# Patient Record
Sex: Female | Born: 1960 | Race: White | Hispanic: No | Marital: Married
Health system: Southern US, Community
[De-identification: ages and names within clinical notes are randomized; demographics above are authoritative.]

---

## 2018-04-07 ENCOUNTER — Inpatient Hospital Stay (HOSPITAL_COMMUNITY)
Admission: AD | Admit: 2018-04-07 | Discharge: 2018-04-12 | DRG: 200 | Disposition: A | Discharge status: Home / Self Care | Payer: No Typology Code available for payment source | Source: Other Acute Inpatient Hospital | Attending: Surgery | Admitting: Surgery

## 2018-04-07 ENCOUNTER — Emergency Department
Admission: EM | Admit: 2018-04-07 | Discharge: 2018-04-07 | Payer: No Typology Code available for payment source | Attending: Emergency Medicine | Admitting: Emergency Medicine

## 2018-04-07 ENCOUNTER — Emergency Department: Payer: No Typology Code available for payment source

## 2018-04-07 ENCOUNTER — Other Ambulatory Visit: Payer: Self-pay

## 2018-04-07 DIAGNOSIS — Y92008 Other place in unspecified non-institutional (private) residence as the place of occurrence of the external cause: Secondary | ICD-10-CM | POA: Diagnosis not present

## 2018-04-07 DIAGNOSIS — Z9689 Presence of other specified functional implants: Secondary | ICD-10-CM

## 2018-04-07 DIAGNOSIS — S299XXA Unspecified injury of thorax, initial encounter: Secondary | ICD-10-CM | POA: Diagnosis present

## 2018-04-07 DIAGNOSIS — R55 Syncope and collapse: Secondary | ICD-10-CM | POA: Insufficient documentation

## 2018-04-07 DIAGNOSIS — S2242XA Multiple fractures of ribs, left side, initial encounter for closed fracture: Secondary | ICD-10-CM | POA: Diagnosis present

## 2018-04-07 DIAGNOSIS — W133XXA Fall through floor, initial encounter: Secondary | ICD-10-CM | POA: Insufficient documentation

## 2018-04-07 DIAGNOSIS — S2249XA Multiple fractures of ribs, unspecified side, initial encounter for closed fracture: Secondary | ICD-10-CM | POA: Diagnosis present

## 2018-04-07 DIAGNOSIS — S2239XA Fracture of one rib, unspecified side, initial encounter for closed fracture: Secondary | ICD-10-CM | POA: Diagnosis present

## 2018-04-07 DIAGNOSIS — Y9389 Activity, other specified: Secondary | ICD-10-CM | POA: Insufficient documentation

## 2018-04-07 DIAGNOSIS — S270XXA Traumatic pneumothorax, initial encounter: Secondary | ICD-10-CM | POA: Diagnosis not present

## 2018-04-07 DIAGNOSIS — R109 Unspecified abdominal pain: Secondary | ICD-10-CM | POA: Insufficient documentation

## 2018-04-07 DIAGNOSIS — S272XXA Traumatic hemopneumothorax, initial encounter: Principal | ICD-10-CM | POA: Diagnosis present

## 2018-04-07 DIAGNOSIS — Z938 Other artificial opening status: Secondary | ICD-10-CM

## 2018-04-07 DIAGNOSIS — J942 Hemothorax: Secondary | ICD-10-CM

## 2018-04-07 DIAGNOSIS — R0789 Other chest pain: Secondary | ICD-10-CM | POA: Insufficient documentation

## 2018-04-07 DIAGNOSIS — J939 Pneumothorax, unspecified: Secondary | ICD-10-CM

## 2018-04-07 DIAGNOSIS — Y990 Civilian activity done for income or pay: Secondary | ICD-10-CM | POA: Insufficient documentation

## 2018-04-07 LAB — CBC WITH DIFFERENTIAL/PLATELET
BASOS ABS: 0 10*3/uL (ref 0–0.1)
Basophils Relative: 0 %
Eosinophils Absolute: 0.1 10*3/uL (ref 0–0.7)
Eosinophils Relative: 1 %
HEMATOCRIT: 36.5 % (ref 35.0–47.0)
HEMOGLOBIN: 12.2 g/dL (ref 12.0–16.0)
Lymphocytes Relative: 24 %
Lymphs Abs: 2.7 10*3/uL (ref 1.0–3.6)
MCH: 29.8 pg (ref 26.0–34.0)
MCHC: 33.4 g/dL (ref 32.0–36.0)
MCV: 88.9 fL (ref 80.0–100.0)
Monocytes Absolute: 0.8 10*3/uL (ref 0.2–0.9)
Monocytes Relative: 7 %
NEUTROS ABS: 7.6 10*3/uL — AB (ref 1.4–6.5)
NEUTROS PCT: 68 %
Platelets: 285 10*3/uL (ref 150–440)
RBC: 4.1 MIL/uL (ref 3.80–5.20)
RDW: 12.7 % (ref 11.5–14.5)
WBC: 11.2 10*3/uL — ABNORMAL HIGH (ref 3.6–11.0)

## 2018-04-07 LAB — URINALYSIS, COMPLETE (UACMP) WITH MICROSCOPIC
BACTERIA UA: NONE SEEN
Bilirubin Urine: NEGATIVE
Glucose, UA: NEGATIVE mg/dL
Hgb urine dipstick: NEGATIVE
KETONES UR: 5 mg/dL — AB
LEUKOCYTES UA: NEGATIVE
Nitrite: NEGATIVE
Protein, ur: NEGATIVE mg/dL
SPECIFIC GRAVITY, URINE: 1.018 (ref 1.005–1.030)
SQUAMOUS EPITHELIAL / LPF: NONE SEEN (ref 0–5)
pH: 7 (ref 5.0–8.0)

## 2018-04-07 LAB — COMPREHENSIVE METABOLIC PANEL
ALK PHOS: 38 U/L (ref 38–126)
ALT: 14 U/L (ref 0–44)
AST: 21 U/L (ref 15–41)
Albumin: 4.4 g/dL (ref 3.5–5.0)
Anion gap: 8 (ref 5–15)
BILIRUBIN TOTAL: 0.7 mg/dL (ref 0.3–1.2)
BUN: 15 mg/dL (ref 6–20)
CALCIUM: 9.2 mg/dL (ref 8.9–10.3)
CO2: 28 mmol/L (ref 22–32)
Chloride: 103 mmol/L (ref 98–111)
Creatinine, Ser: 0.91 mg/dL (ref 0.44–1.00)
GFR calc Af Amer: >60 mL/min (ref >60)
GFR calc non Af Amer: >60 mL/min (ref >60)
GLUCOSE: 132 mg/dL — AB (ref 70–99)
Potassium: 3.7 mmol/L (ref 3.5–5.1)
Sodium: 139 mmol/L (ref 135–145)
TOTAL PROTEIN: 6.9 g/dL (ref 6.5–8.1)

## 2018-04-07 LAB — LIPASE, BLOOD: Lipase: 29 U/L (ref 11–51)

## 2018-04-07 LAB — MRSA PCR SCREENING: MRSA by PCR: NEGATIVE

## 2018-04-07 MED ORDER — SODIUM CHLORIDE 0.9 % IV BOLUS
1000.0000 mL | Freq: Once | INTRAVENOUS | Status: AC
  Administered 2018-04-07: 1000 mL via INTRAVENOUS

## 2018-04-07 MED ORDER — FENTANYL CITRATE (PF) 100 MCG/2ML IJ SOLN
100.0000 ug | Freq: Once | INTRAMUSCULAR | Status: AC
  Administered 2018-04-07: 100 ug via INTRAVENOUS
  Filled 2018-04-07: qty 2

## 2018-04-07 MED ORDER — MORPHINE SULFATE (PF) 4 MG/ML IV SOLN
6.0000 mg | Freq: Once | INTRAVENOUS | Status: AC
  Administered 2018-04-07: 6 mg via INTRAVENOUS
  Filled 2018-04-07: qty 2

## 2018-04-07 MED ORDER — OXYCODONE HCL 5 MG PO TABS: 5.0000 mg | ORAL_TABLET | ORAL | Status: DC | PRN

## 2018-04-07 MED ORDER — NALOXONE HCL 0.4 MG/ML IJ SOLN: 0.4000 mg | INTRAMUSCULAR | Status: DC | PRN

## 2018-04-07 MED ORDER — HYDROMORPHONE 1 MG/ML IV SOLN
INTRAVENOUS | Status: DC
  Administered 2018-04-07: 22:00:00 via INTRAVENOUS
  Administered 2018-04-07: 1 mg via INTRAVENOUS
  Administered 2018-04-08: 1.2 mg via INTRAVENOUS
  Administered 2018-04-08: 1.8 mg via INTRAVENOUS
  Administered 2018-04-09: 0.6 mg via INTRAVENOUS
  Administered 2018-04-09: 1.8 mg via INTRAVENOUS
  Filled 2018-04-07: qty 25

## 2018-04-07 MED ORDER — MORPHINE SULFATE (PF) 4 MG/ML IV SOLN: 4.0000 mg | Freq: Once | INTRAVENOUS | Status: AC

## 2018-04-07 MED ORDER — ONDANSETRON HCL 4 MG/2ML IJ SOLN
4.0000 mg | Freq: Four times a day (QID) | INTRAMUSCULAR | Status: DC | PRN
  Administered 2018-04-08 (×2): 4 mg via INTRAVENOUS
  Filled 2018-04-07: qty 2

## 2018-04-07 MED ORDER — ONDANSETRON HCL 4 MG/2ML IJ SOLN
INTRAMUSCULAR | Status: AC
  Administered 2018-04-07: 4 mg
  Filled 2018-04-07: qty 2

## 2018-04-07 MED ORDER — ENOXAPARIN SODIUM 40 MG/0.4ML ~~LOC~~ SOLN
40.0000 mg | SUBCUTANEOUS | Status: DC
  Administered 2018-04-08 – 2018-04-11 (×4): 40 mg via SUBCUTANEOUS
  Filled 2018-04-07 (×6): qty 0.4

## 2018-04-07 MED ORDER — ONDANSETRON HCL 4 MG/2ML IJ SOLN
4.0000 mg | Freq: Once | INTRAMUSCULAR | Status: AC
  Administered 2018-04-07: 4 mg via INTRAVENOUS
  Filled 2018-04-07: qty 2

## 2018-04-07 MED ORDER — DIPHENHYDRAMINE HCL 50 MG/ML IJ SOLN
12.5000 mg | Freq: Four times a day (QID) | INTRAMUSCULAR | Status: DC | PRN
  Administered 2018-04-08 – 2018-04-09 (×3): 12.5 mg via INTRAVENOUS
  Filled 2018-04-07 (×3): qty 1

## 2018-04-07 MED ORDER — MORPHINE SULFATE (PF) 4 MG/ML IV SOLN
INTRAVENOUS | Status: AC
  Administered 2018-04-07: 4 mg via INTRAVENOUS
  Filled 2018-04-07: qty 1

## 2018-04-07 MED ORDER — DIPHENHYDRAMINE HCL 12.5 MG/5ML PO ELIX
12.5000 mg | ORAL_SOLUTION | Freq: Four times a day (QID) | ORAL | Status: DC | PRN
  Administered 2018-04-09 (×2): 12.5 mg via ORAL
  Filled 2018-04-07 (×2): qty 5

## 2018-04-07 MED ORDER — SODIUM CHLORIDE 0.9% FLUSH: 9.0000 mL | INTRAVENOUS | Status: DC | PRN

## 2018-04-07 MED ORDER — HYDROMORPHONE HCL 1 MG/ML IJ SOLN
0.5000 mg | Freq: Once | INTRAMUSCULAR | Status: AC
  Administered 2018-04-07: 0.5 mg via INTRAVENOUS
  Filled 2018-04-07: qty 1

## 2018-04-07 MED ORDER — NITROGLYCERIN IN D5W 200-5 MCG/ML-% IV SOLN
INTRAVENOUS | Status: AC
  Filled 2018-04-07: qty 250

## 2018-04-07 MED ORDER — MORPHINE SULFATE (PF) 4 MG/ML IV SOLN
4.0000 mg | INTRAVENOUS | Status: DC | PRN
  Administered 2018-04-07: 4 mg via INTRAVENOUS

## 2018-04-07 MED ORDER — POTASSIUM CHLORIDE IN NACL 20-0.9 MEQ/L-% IV SOLN
INTRAVENOUS | Status: DC
  Administered 2018-04-07 – 2018-04-09 (×3): via INTRAVENOUS
  Filled 2018-04-07 (×3): qty 1000

## 2018-04-07 MED ORDER — IOPAMIDOL (ISOVUE-300) INJECTION 61%
100.0000 mL | Freq: Once | INTRAVENOUS | Status: AC | PRN
  Administered 2018-04-07: 100 mL via INTRAVENOUS

## 2018-04-07 MED ORDER — ONDANSETRON HCL 4 MG/2ML IJ SOLN
4.0000 mg | Freq: Four times a day (QID) | INTRAMUSCULAR | Status: DC | PRN
  Filled 2018-04-07: qty 2

## 2018-04-07 MED ORDER — MORPHINE SULFATE (PF) 4 MG/ML IV SOLN
INTRAVENOUS | Status: AC
  Administered 2018-04-07: 4 mg
  Filled 2018-04-07: qty 1

## 2018-04-07 MED ORDER — OXYCODONE HCL 5 MG PO TABS
10.0000 mg | ORAL_TABLET | ORAL | Status: DC | PRN
  Administered 2018-04-07 – 2018-04-12 (×19): 10 mg via ORAL
  Filled 2018-04-07 (×20): qty 2

## 2018-04-07 MED ORDER — ONDANSETRON 4 MG PO TBDP: 4.0000 mg | ORAL_TABLET | Freq: Four times a day (QID) | ORAL | Status: DC | PRN

## 2018-04-07 MED ORDER — HYDROMORPHONE HCL 1 MG/ML IJ SOLN
1.0000 mg | Freq: Once | INTRAMUSCULAR | Status: AC
  Administered 2018-04-07: 1 mg via INTRAVENOUS
  Filled 2018-04-07: qty 1

## 2018-04-07 NOTE — H&P (Signed)
History   Vanessa Crosby is an 57 y.o. female.   Chief Complaint: No chief complaint on file. FALL  HPI This is a 57 year old female who was transferred from [place] after being injured in a fall.  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  During her work-up, she was found to have multiple left-sided rib fractures with a flail segment and a small apical pneumothorax.  He was requested to transfer to the [facility] trauma service.  She reports only the left-sided rib and flank pain.  She has a mild headache.  She denies neck pain, extremity pain, abdominal pain, or pelvic pain.  She denies shortness of breath.  No past medical history on file.   No family history on file. Social History:  has no tobacco, alcohol, and drug history on file.  Allergies   Allergies  Allergen Reactions  . Penicillins Rash    Home Medications   No medications prior to admission.    Trauma Course   Results for orders placed or performed during the hospital encounter of 04/07/18 (from the past 48 hour(s))  CBC with Differential     Status: Abnormal   Collection Time: 04/07/18 12:14 PM  Result Value Ref Range   WBC 11.2 (H) 3.6 - 11.0 K/uL   RBC 4.10 3.80 - 5.20 MIL/uL   Hemoglobin 12.2 12.0 - 16.0 g/dL   HCT 36.5 35.0 - 47.0 %   MCV 88.9 80.0 - 100.0 fL   MCH 29.8 26.0 - 34.0 pg   MCHC 33.4 32.0 - 36.0 g/dL   RDW 12.7 11.5 - 14.5 %   Platelets 285 150 - 440 K/uL   Neutrophils Relative % 68 %   Neutro Abs 7.6 (H) 1.4 - 6.5 K/uL   Lymphocytes Relative 24 %   Lymphs Abs 2.7 1.0 - 3.6 K/uL   Monocytes Relative 7 %   Monocytes Absolute 0.8 0.2 - 0.9 K/uL   Eosinophils Relative 1 %   Eosinophils Absolute 0.1 0 - 0.7 K/uL   Basophils Relative 0 %   Basophils Absolute 0.0 0 - 0.1 K/uL    Comment: Performed at [facility], [address] [place], [place] [address]  Comprehensive metabolic panel     Status: Abnormal    Collection Time: 04/07/18 12:14 PM  Result Value Ref Range   Sodium 139 135 - 145 mmol/L   Potassium 3.7 3.5 - 5.1 mmol/L   Chloride 103 98 - 111 mmol/L   CO2 28 22 - 32 mmol/L   Glucose, Bld 132 (H) 70 - 99 mg/dL   BUN 15 6 - 20 mg/dL   Creatinine, Ser 0.91 0.44 - 1.00 mg/dL   Calcium 9.2 8.9 - 10.3 mg/dL   Total Protein 6.9 6.5 - 8.1 g/dL   Albumin 4.4 3.5 - 5.0 g/dL   AST 21 15 - 41 U/L   ALT 14 0 - 44 U/L   Alkaline Phosphatase 38 38 - 126 U/L   Total Bilirubin 0.7 0.3 - 1.2 mg/dL   GFR calc non Af Amer >60 >60 mL/min   GFR calc Af Amer >60 >60 mL/min    Comment: (NOTE) The eGFR has been calculated using the CKD EPI equation. This calculation has not been validated in all clinical situations. eGFR's persistently <60 mL/min signify possible Chronic Kidney Disease.    Anion gap 8 5 - 15    Comment: Performed at [facility], [place], [place] [address]  Lipase, blood     Status: None   Collection Time: 04/07/18 12:14 PM  Result Value Ref Range   Lipase 29 11 - 51 U/L    Comment: Performed at [facility], [address] [place], [place] [address]  Urinalysis, Complete w Microscopic     Status: Abnormal   Collection Time: 04/07/18 12:14 PM  Result Value Ref Range   Color, Urine STRAW (A) YELLOW   APPearance CLEAR (A) CLEAR   Specific Gravity, Urine 1.018 1.005 - 1.030   pH 7.0 5.0 - 8.0   Glucose, UA NEGATIVE NEGATIVE mg/dL   Hgb urine dipstick NEGATIVE NEGATIVE   Bilirubin Urine NEGATIVE NEGATIVE   Ketones, ur 5 (A) NEGATIVE mg/dL   Protein, ur NEGATIVE NEGATIVE mg/dL   Nitrite NEGATIVE NEGATIVE   Leukocytes, UA NEGATIVE NEGATIVE   RBC / HPF 0-5 0 - 5 RBC/hpf   WBC, UA 0-5 0 - 5 WBC/hpf   Bacteria, UA NONE SEEN NONE SEEN   Squamous Epithelial / LPF NONE SEEN 0 - 5    Comment: Performed at [facility], [address], [place], [place] [address]   Dg Chest 1 View  Result Date: 04/07/2018 CLINICAL DATA:  Fall at  work. Left rib and flank pain. Shortness of breath. EXAM: CHEST  1 VIEW COMPARISON:  None. FINDINGS: 1230 hours. The heart size and mediastinal contours are normal without evidence of mediastinal hematoma or shift. There is mild atelectasis at both lung bases. There is a small left apical pneumothorax, approximately 10%. Multiple left-sided rib fractures are present, including moderately displaced fractures of the 9th and 10th ribs posteriorly. In addition, there are nondisplaced fractures of the 6, 7th and 8th ribs. Previous cervical fusion noted. IMPRESSION: Multiple left-sided rib fractures, including at least 2 that are moderately displaced. Associated small left apical pneumothorax. Critical Value/emergent results were called by telephone at the time of interpretation on 04/07/2018 at 12:45 pm to Dr. Larae Grooms , who verbally acknowledged these results. Electronically Signed   By: Richardean Sale M.D.   On: 04/07/2018 12:46   Ct Head Wo Contrast  Result Date: 04/07/2018 CLINICAL DATA:  Pain following fall EXAM: CT HEAD WITHOUT CONTRAST CT CERVICAL SPINE WITHOUT CONTRAST TECHNIQUE: Multidetector CT imaging of the head and cervical spine was performed following the standard protocol without intravenous contrast. Multiplanar CT image reconstructions of the cervical spine were also generated. COMPARISON:  None. FINDINGS: CT HEAD FINDINGS Brain: The ventricles are normal in size and configuration. There is no intracranial mass, hemorrhage, extra-axial fluid collection, or midline shift. Gray-white compartments appear normal. No evident acute infarct. Vascular: No hyperdense vessel. There is no appreciable vascular calcification. Skull: The bony calvarium appears intact. Sinuses/Orbits: There is mucosal thickening in several ethmoid air cells bilaterally. Other visualized paranasal sinuses are clear. Orbits appear symmetric bilaterally. Other: Mastoid air cells are clear. CT CERVICAL SPINE FINDINGS Alignment:  There is 2 mm of retrolisthesis of C4 on C5. No other spondylolisthesis is evident. Skull base and vertebrae: The patient is status post anterior screw and plate fixation at C5 and C6 with support hardware intact. Skull base and craniocervical junction regions appear normal. There is no demonstrable fracture. There are no blastic or lytic bone lesions. Soft tissues and spinal canal: Prevertebral soft tissues and predental space regions are normal. No paraspinous lesions. No cord canal hematoma evident. Disc levels: There is ankylosis at C5 and C6 with disc spacer in  this area. There is moderately severe disc space narrowing at C4-5 and C6-7. There are anterior osteophytes at C4, C6, and C7. There is calcification in the anterior ligament at C4-5 and C7-T1. There is facet hypertrophy at several levels bilaterally. There is no frank disc extrusion or stenosis. Upper chest: There is an apical pneumothorax on the left. There is a 4 mm nodular opacity in the posterior segment of the right upper lobe near the apex. There is a nearby 2 mm nodular opacity in this area. Other: None IMPRESSION: CT head: Mucosal thickening in several ethmoid air cells. Study otherwise unremarkable. CT cervical spine: 1.  Postoperative changes at C5 and C6 with support hardware intact. 2. No evident acute fracture. Slight spondylolisthesis at C4-5 is felt to be due to underlying spondylosis. 3.  Multilevel osteoarthritic change. 4. Left apical pneumothorax, reported to the emergency department at the time of recent chest radiograph reporting. Chest CT ordered. 5. Small nodular opacities right upper lobe near the apex. Complete chest CT ordered which no doubt will provide further assessment of this area. Electronically Signed   By: Lowella Grip III M.D.   On: 04/07/2018 14:18   Ct Chest W Contrast  Result Date: 04/07/2018 CLINICAL DATA:  57 y/o F; fall landing on a metal pole with left rib and flank injury. EXAM: CT CHEST, ABDOMEN, AND  PELVIS WITH CONTRAST TECHNIQUE: Multidetector CT imaging of the chest, abdomen and pelvis was performed following the standard protocol during bolus administration of intravenous contrast. CONTRAST:  175m ISOVUE-300 IOPAMIDOL (ISOVUE-300) INJECTION 61% COMPARISON:  04/07/2018 chest radiograph. FINDINGS: CT CHEST FINDINGS Cardiovascular: No significant vascular findings. Normal heart size. No pericardial effusion. Mediastinum/Nodes: No enlarged mediastinal, hilar, or axillary lymph nodes. Thyroid gland, trachea, and esophagus demonstrate no significant findings. Lungs/Pleura: Small left pneumothorax. No consolidation or pleural effusion. Musculoskeletal: Left ribs 6-10 posterior and lateral comminuted acute fractures. The left rib 9-10 posterior acute fractures are at least 1 shaft's width displaced and additional ribs 6-10 fractures are mildly displaced. Left rib 5 anterior nondisplaced acute acute fracture. Left rib 11 posterior comminuted minimally displaced acute fracture. Left rib 12 posterior nondisplaced acute fracture. There is extensive edema and several foci of air within the chest wall within the areas of the fractures extending into the left flank. CT ABDOMEN PELVIS FINDINGS Hepatobiliary: No hepatic injury or perihepatic hematoma. Gallbladder is unremarkable Pancreas: Unremarkable. No pancreatic ductal dilatation or surrounding inflammatory changes. Spleen: No splenic injury or perisplenic hematoma. Adrenals/Urinary Tract: No adrenal hemorrhage or renal injury identified. Bladder is unremarkable. Stomach/Bowel: Stomach is within normal limits. Appendix appears normal. No evidence of bowel wall thickening, distention, or inflammatory changes. Vascular/Lymphatic: No significant vascular findings are present. No enlarged abdominal or pelvic lymph nodes. Reproductive: Uterus and bilateral adnexa are unremarkable. Other: No abdominal wall hernia or abnormality. No abdominopelvic ascites. Musculoskeletal: No  acute or significant osseous findings. IMPRESSION: 1. Left ribs 6-10 posterior and lateral acute fractures. Left ribs 9-10 posterior fractures are at least 1 shaft's width displaced. Left rib 5 anterior as well as left ribs 11-12 posterior acute fractures. 2. Small left pneumothorax is stable. 3. No additional acute fracture or internal injury identified. Electronically Signed   By: LKristine GarbeM.D.   On: 04/07/2018 14:25   Ct Cervical Spine Wo Contrast  Result Date: 04/07/2018 CLINICAL DATA:  Pain following fall EXAM: CT HEAD WITHOUT CONTRAST CT CERVICAL SPINE WITHOUT CONTRAST TECHNIQUE: Multidetector CT imaging of the head and cervical spine was performed following the  standard protocol without intravenous contrast. Multiplanar CT image reconstructions of the cervical spine were also generated. COMPARISON:  None. FINDINGS: CT HEAD FINDINGS Brain: The ventricles are normal in size and configuration. There is no intracranial mass, hemorrhage, extra-axial fluid collection, or midline shift. Gray-white compartments appear normal. No evident acute infarct. Vascular: No hyperdense vessel. There is no appreciable vascular calcification. Skull: The bony calvarium appears intact. Sinuses/Orbits: There is mucosal thickening in several ethmoid air cells bilaterally. Other visualized paranasal sinuses are clear. Orbits appear symmetric bilaterally. Other: Mastoid air cells are clear. CT CERVICAL SPINE FINDINGS Alignment: There is 2 mm of retrolisthesis of C4 on C5. No other spondylolisthesis is evident. Skull base and vertebrae: The patient is status post anterior screw and plate fixation at C5 and C6 with support hardware intact. Skull base and craniocervical junction regions appear normal. There is no demonstrable fracture. There are no blastic or lytic bone lesions. Soft tissues and spinal canal: Prevertebral soft tissues and predental space regions are normal. No paraspinous lesions. No cord canal  hematoma evident. Disc levels: There is ankylosis at C5 and C6 with disc spacer in this area. There is moderately severe disc space narrowing at C4-5 and C6-7. There are anterior osteophytes at C4, C6, and C7. There is calcification in the anterior ligament at C4-5 and C7-T1. There is facet hypertrophy at several levels bilaterally. There is no frank disc extrusion or stenosis. Upper chest: There is an apical pneumothorax on the left. There is a 4 mm nodular opacity in the posterior segment of the right upper lobe near the apex. There is a nearby 2 mm nodular opacity in this area. Other: None IMPRESSION: CT head: Mucosal thickening in several ethmoid air cells. Study otherwise unremarkable. CT cervical spine: 1.  Postoperative changes at C5 and C6 with support hardware intact. 2. No evident acute fracture. Slight spondylolisthesis at C4-5 is felt to be due to underlying spondylosis. 3.  Multilevel osteoarthritic change. 4. Left apical pneumothorax, reported to the emergency department at the time of recent chest radiograph reporting. Chest CT ordered. 5. Small nodular opacities right upper lobe near the apex. Complete chest CT ordered which no doubt will provide further assessment of this area. Electronically Signed   By: Lowella Grip III M.D.   On: 04/07/2018 14:18   Ct Abdomen Pelvis W Contrast  Result Date: 04/07/2018 CLINICAL DATA:  57 y/o F; fall landing on a metal pole with left rib and flank injury. EXAM: CT CHEST, ABDOMEN, AND PELVIS WITH CONTRAST TECHNIQUE: Multidetector CT imaging of the chest, abdomen and pelvis was performed following the standard protocol during bolus administration of intravenous contrast. CONTRAST:  117m ISOVUE-300 IOPAMIDOL (ISOVUE-300) INJECTION 61% COMPARISON:  04/07/2018 chest radiograph. FINDINGS: CT CHEST FINDINGS Cardiovascular: No significant vascular findings. Normal heart size. No pericardial effusion. Mediastinum/Nodes: No enlarged mediastinal, hilar, or axillary  lymph nodes. Thyroid gland, trachea, and esophagus demonstrate no significant findings. Lungs/Pleura: Small left pneumothorax. No consolidation or pleural effusion. Musculoskeletal: Left ribs 6-10 posterior and lateral comminuted acute fractures. The left rib 9-10 posterior acute fractures are at least 1 shaft's width displaced and additional ribs 6-10 fractures are mildly displaced. Left rib 5 anterior nondisplaced acute acute fracture. Left rib 11 posterior comminuted minimally displaced acute fracture. Left rib 12 posterior nondisplaced acute fracture. There is extensive edema and several foci of air within the chest wall within the areas of the fractures extending into the left flank. CT ABDOMEN PELVIS FINDINGS Hepatobiliary: No hepatic injury or perihepatic hematoma.  Gallbladder is unremarkable Pancreas: Unremarkable. No pancreatic ductal dilatation or surrounding inflammatory changes. Spleen: No splenic injury or perisplenic hematoma. Adrenals/Urinary Tract: No adrenal hemorrhage or renal injury identified. Bladder is unremarkable. Stomach/Bowel: Stomach is within normal limits. Appendix appears normal. No evidence of bowel wall thickening, distention, or inflammatory changes. Vascular/Lymphatic: No significant vascular findings are present. No enlarged abdominal or pelvic lymph nodes. Reproductive: Uterus and bilateral adnexa are unremarkable. Other: No abdominal wall hernia or abnormality. No abdominopelvic ascites. Musculoskeletal: No acute or significant osseous findings. IMPRESSION: 1. Left ribs 6-10 posterior and lateral acute fractures. Left ribs 9-10 posterior fractures are at least 1 shaft's width displaced. Left rib 5 anterior as well as left ribs 11-12 posterior acute fractures. 2. Small left pneumothorax is stable. 3. No additional acute fracture or internal injury identified. Electronically Signed   By: Kristine Garbe M.D.   On: 04/07/2018 14:25    Review of Systems  Respiratory:  Negative for shortness of breath.   Cardiovascular: Positive for chest pain.  Gastrointestinal: Negative for abdominal pain.  Musculoskeletal: Negative for back pain and neck pain.  All other systems reviewed and are negative.   Blood pressure 133/77, pulse (!) 107, temperature 98.2 F (36.8 C), temperature source Oral, weight 59.3 kg, SpO2 100 %. Physical Exam  Constitutional: She is oriented to person, place, and time. She appears well-developed and well-nourished.  Uncomfortable appearing lying on her right side  HENT:  Head: Normocephalic and atraumatic.  Right Ear: External ear normal.  Left Ear: External ear normal.  Nose: Nose normal.  Mouth/Throat: Oropharynx is clear and moist.  Eyes: Pupils are equal, round, and reactive to light. Right eye exhibits no discharge. Left eye exhibits no discharge. No scleral icterus.  Neck: Normal range of motion. Neck supple. No tracheal deviation present.  Cardiovascular: Normal rate, regular rhythm, normal heart sounds and intact distal pulses.  No murmur heard. Respiratory: Effort normal. No respiratory distress. She exhibits tenderness.  Some crepitus on the left with decreased breath sounds  GI: Soft. There is no tenderness. There is no guarding.  Musculoskeletal: Normal range of motion. She exhibits no edema or deformity.  Neurological: She is alert and oriented to person, place, and time.  Skin: Skin is warm and dry. No erythema.  Psychiatric: Her behavior is normal. Judgment normal.     Assessment/Plan Patient status post fall with multiple left-sided rib fractures and small pneumothorax  I discussed the diagnosis with the patient and her family.  Aggressive pain management and pulmonary toilet strongly recommended to try and avoid pneumonia or other pulmonary complications.  We will repeat her chest x-ray and if the pneumothorax expands, she may require a chest tube placement.  Also, given the flail segment, if she does not improve,  we may have to consider a consultation from thoracic surgery.  The patient and family are in agreement with the plan  BLACKMAN,DOUGLAS A 04/07/2018, 7:21 PM   Procedures

## 2018-04-07 NOTE — ED Notes (Signed)
Pt gave permission for wife, [place] Nickeletra Fugger, to sign consent for pt to be transferred to [facility].

## 2018-04-07 NOTE — ED Notes (Signed)
EMTALA reviewed by this RN.  

## 2018-04-07 NOTE — ED Triage Notes (Signed)
Pt to ED via [facility] from work site for fall. Pt fell 3 feet and landed on a metal pole injuring left ribs and left flank. Pt then fell approximately 5 feet landing on her buttocks. Pt has bruising to the left flank and is c/o left flank pain. Pt appears uncomfortable at this time.

## 2018-04-07 NOTE — Progress Notes (Signed)
Workers Oncologistcomp info paper from Associate Professormployer added to shadow chart.

## 2018-04-07 NOTE — ED Notes (Signed)
[facility] leaving at this time. 

## 2018-04-07 NOTE — ED Notes (Signed)
Pt was placed on 2 lpm via [place] per verbal order from Dr. Seabron SpatesSchavetiz.

## 2018-04-07 NOTE — Progress Notes (Signed)
Patient transferred from [facility].   Paged doctor for orders but he is currently in the OR, will add orders ASAP.  Patient is in the room lying on her right side which she states is the "most comfortable position".   Wife is at bedside.

## 2018-04-07 NOTE — Progress Notes (Addendum)
1900 Dr. Magnus IvanBlackman paged for order clarification on PCA and morphine order questioned by pharmacist, Aggie Cosierheresa. Awaiting call back from DR. Magnus IvanBlackman.   1930 Bedside shift report, pt resting in bed, family at bedside. Pt A&Ox4, says she's not in pain if she "doesn't move" or take "deep breaths". Pt assessed, see flowsheet. Pt educated about PCA pump that's ordered and incentive spirometer.  Pt understands without assistance. WCTM.   2030 Dr. Magnus IvanBlackman paged for 2nd time. New orders received. Informed MD of growing hematoma on pt's left flank. No new orders. WCTM.   2145 PCA set up and pt educated. Pt states the morphine took some of the "edge off". Spouse at bedside, pt resting comfortably in bed.   2300 Report given to [facility], RN. Pt resting comfortably. NAD.

## 2018-04-07 NOTE — ED Notes (Signed)
Verbal report given to Jodene Namamera, Charity fundraiserN at [facility].

## 2018-04-07 NOTE — ED Provider Notes (Signed)
[facility] Emergency Department Provider Note ____________________________________________   First MD Initiated Contact with Patient 04/07/18 1155     (approximate)  I have reviewed the triage vital signs and the nursing notes.   HISTORY  Chief Complaint Fall  HPI Vanessa Crosby is a 57 y.o. female with a history of GERD who is presenting to the emergency department after a fall.  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  She is having severe, 10 out of 10 sharp pain to her left flank and back which worsens with deep breathing.  She is not on any blood thinners and says that she did not hit her head or neck but almost lost consciousness, she thinks from pain.  History reviewed. No pertinent past medical history.  There are no active problems to display for this patient.    Prior to Admission medications   Not on File    Allergies Penicillins  No family history on file.  Social History Social History   Tobacco Use  . Smoking status: Not on file  Substance Use Topics  . Alcohol use: Not on file  . Drug use: Not on file    Review of Systems  Constitutional: No fever/chills Eyes: No visual changes. ENT: No sore throat. Cardiovascular: Denies chest pain. Respiratory: Denies shortness of breath. Gastrointestinal: No abdominal pain.  No nausea, no vomiting.  No diarrhea.  No constipation. Genitourinary: Negative for dysuria. Musculoskeletal: As above Skin: Negative for rash. Neurological: Negative for headaches, focal weakness or numbness.   ____________________________________________   PHYSICAL EXAM:  VITAL SIGNS: ED Triage Vitals  Enc Vitals Group     BP 04/07/18 1219 117/67     Pulse Rate 04/07/18 1219 98     Resp --      Temp 04/07/18 1219 97.6 F  (36.4 C)     Temp Source 04/07/18 1219 Oral     SpO2 04/07/18 1219 100 %     Weight 04/07/18 1207 140 lb (63.5 kg)     Height 04/07/18 1207 5\' 7"  (1.702 m)     Head Circumference --      Peak Flow --      Pain Score 04/07/18 1219 10     Pain Loc --      Pain Edu? --      Excl. in GC? --     Constitutional: Alert and oriented.  Patient appears extremely uncomfortable, lying on her right side and wincing in pain. Eyes: Conjunctivae are normal.  Head: Atraumatic. Nose: No congestion/rhinnorhea. Mouth/Throat: Mucous membranes are moist.  Neck: No stridor.   Cardiovascular: Normal rate, regular rhythm. Grossly normal heart sounds.  Respiratory: Normal respiratory effort.  No retractions. Lungs CTAB. Gastrointestinal: Soft and nontender. No distention.  Musculoskeletal: No lower extremity tenderness nor edema.  No joint effusions.  Hips are stable bilaterally the patient ranges her bilateral lower externally is 5 out of 5 strength.  Swollen ecchymotic area to the left CVA region that is approximately 4 x 6 cm and raised and very tender.  Also with lateral tenderness to the left-sided chest wall.  No anterior tenderness to palpation of the chest.  No cervical tenderness to palpation nor step-off.  No signs of trauma above the neck.  Neurologic:  Normal speech and language. No gross  focal neurologic deficits are appreciated. Skin:  Skin is warm, dry and intact. No rash noted. Psychiatric: Mood and affect are normal. Speech and behavior are normal.  ____________________________________________   LABS (all labs ordered are listed, but only abnormal results are displayed)  Labs Reviewed  CBC WITH DIFFERENTIAL/PLATELET - Abnormal; Notable for the following components:      Result Value   WBC 11.2 (*)    Neutro Abs 7.6 (*)    All other components within normal limits  COMPREHENSIVE METABOLIC PANEL - Abnormal; Notable for the following components:   Glucose, Bld 132 (*)    All other  components within normal limits  LIPASE, BLOOD  URINALYSIS, COMPLETE (UACMP) WITH MICROSCOPIC   ____________________________________________  EKG   ____________________________________________  RADIOLOGY  Multiple rib fracture on the left side with 2 that appear displaced and a small apical pneumothorax that I discussed with the radiologist, Dr. Purcell MoutonVeazey, who estimated this at approximate 10%.  CT head and C-spine without acute finding.  Left ribs 6 through 10 posterior and lateral acute fractures.  Left ribs 9 through 10 posterior fractures are at least 1 shaft width displaced.  Left rib 5 anterior as well as left ribs 11 and 12 posterior acute fractures.  Small left pneumothorax appears stable.  No abdominal injury detected. ____________________________________________   PROCEDURES  Procedure(s) performed:   .Critical Care Performed by: Myrna BlazerSchaevitz, Yuuki Skeens Matthew, MD Authorized by: Myrna BlazerSchaevitz, Daison Braxton Matthew, MD   Critical care provider statement:    Critical care time (minutes):  45   Critical care was necessary to treat or prevent imminent or life-threatening deterioration of the following conditions:  Trauma   Critical care was time spent personally by me on the following activities:  Discussions with consultants, evaluation of patient's response to treatment, examination of patient, ordering and performing treatments and interventions, ordering and review of laboratory studies, ordering and review of radiographic studies, pulse oximetry, re-evaluation of patient's condition, obtaining history from patient or surrogate and review of old charts    Critical Care performed:    ____________________________________________   INITIAL IMPRESSION / ASSESSMENT AND PLAN / ED COURSE  Pertinent labs & imaging results that were available during my care of the patient were reviewed by me and considered in my medical decision making (see chart for details).  DDX: Multiple rib  fractures, pneumothorax, kidney injury such as a kidney laceration, fracture of the spine, solid organ injury  As part of my medical decision making, I reviewed the following data within the electronic MEDICAL RECORD NUMBERNo prior records on file for review  ----------------------------------------- 2:49 PM on 04/07/2018 -----------------------------------------  Patient at this time with pain better controlled.  She is no longer distressed.  100% oxygen saturation on 2 L nasal cannula oxygen.  Lying on her right side.  Talking calmly with me.  Discussed case with Dr. Maia Planintron who recommends transfer to a trauma center.  [facility] [place]  ----------------------------------------- 3:02 PM on 04/07/2018 -----------------------------------------  Discussed the case with Dr. Janee Mornhompson with [facility].  Accepted the patient onto his service. ____________________________________________   FINAL CLINICAL IMPRESSION(S) / ED DIAGNOSES  Final diagnoses:  Closed fracture of multiple ribs of left side, initial encounter  Traumatic pneumothorax, initial encounter      NEW MEDICATIONS STARTED DURING THIS VISIT:  New Prescriptions   No medications on file     Note:  This document was prepared using Dragon voice recognition software and may include unintentional dictation errors.  Myrna Blazer, MD 04/07/18 [phone]

## 2018-04-08 ENCOUNTER — Inpatient Hospital Stay (HOSPITAL_COMMUNITY): Payer: No Typology Code available for payment source

## 2018-04-08 LAB — BASIC METABOLIC PANEL
Anion gap: 8 (ref 5–15)
BUN: 8 mg/dL (ref 6–20)
CALCIUM: 8.5 mg/dL — AB (ref 8.9–10.3)
CHLORIDE: 104 mmol/L (ref 98–111)
CO2: 25 mmol/L (ref 22–32)
CREATININE: 0.61 mg/dL (ref 0.44–1.00)
GFR calc non Af Amer: >60 mL/min (ref >60)
GLUCOSE: 115 mg/dL — AB (ref 70–99)
Potassium: 3.9 mmol/L (ref 3.5–5.1)
Sodium: 137 mmol/L (ref 135–145)

## 2018-04-08 LAB — CBC
HCT: 35.4 % — ABNORMAL LOW (ref 36.0–46.0)
HEMOGLOBIN: 11.4 g/dL — AB (ref 12.0–15.0)
MCH: 29.3 pg (ref 26.0–34.0)
MCHC: 32.2 g/dL (ref 30.0–36.0)
MCV: 91 fL (ref 78.0–100.0)
Platelets: 245 10*3/uL (ref 150–400)
RBC: 3.89 MIL/uL (ref 3.87–5.11)
RDW: 12 % (ref 11.5–15.5)
WBC: 10.6 10*3/uL — ABNORMAL HIGH (ref 4.0–10.5)

## 2018-04-08 LAB — HIV ANTIBODY (ROUTINE TESTING W REFLEX): HIV SCREEN 4TH GENERATION: NONREACTIVE

## 2018-04-08 MED ORDER — ALPRAZOLAM 0.25 MG PO TABS
0.2500 mg | ORAL_TABLET | Freq: Three times a day (TID) | ORAL | Status: DC | PRN
  Administered 2018-04-09 – 2018-04-12 (×4): 0.25 mg via ORAL
  Filled 2018-04-08 (×4): qty 1

## 2018-04-08 MED ORDER — METHOCARBAMOL 1000 MG/10ML IJ SOLN
1000.0000 mg | Freq: Three times a day (TID) | INTRAVENOUS | Status: DC | PRN
  Administered 2018-04-09: 1000 mg via INTRAVENOUS
  Filled 2018-04-08 (×2): qty 10

## 2018-04-08 MED ORDER — PANTOPRAZOLE SODIUM 40 MG PO TBEC
40.0000 mg | DELAYED_RELEASE_TABLET | Freq: Every day | ORAL | Status: DC
  Administered 2018-04-08 – 2018-04-11 (×4): 40 mg via ORAL
  Filled 2018-04-08 (×4): qty 1

## 2018-04-08 MED ORDER — HYDROMORPHONE HCL 1 MG/ML IJ SOLN
1.0000 mg | Freq: Once | INTRAMUSCULAR | Status: AC
  Administered 2018-04-08: 1 mg via INTRAVENOUS
  Filled 2018-04-08: qty 1

## 2018-04-08 MED ORDER — ACETAMINOPHEN 325 MG PO TABS
650.0000 mg | ORAL_TABLET | Freq: Four times a day (QID) | ORAL | Status: DC | PRN
  Administered 2018-04-08 – 2018-04-10 (×5): 650 mg via ORAL
  Filled 2018-04-08 (×5): qty 2

## 2018-04-08 MED ORDER — PROMETHAZINE HCL 25 MG/ML IJ SOLN
12.5000 mg | Freq: Four times a day (QID) | INTRAMUSCULAR | Status: DC | PRN
  Administered 2018-04-08: 12.5 mg via INTRAVENOUS
  Filled 2018-04-08: qty 1

## 2018-04-08 NOTE — Progress Notes (Signed)
Patient ID: Lottie MusselConnie Crosby, female   DOB: 17-Apr-1961, 57 y.o.   MRN: [id number]    Subjective: L rib pain, SOB  Objective: Vital signs in last 24 hours: Temp:  [97.6 F (36.4 C)-99 F (37.2 C)] 98.2 F (36.8 C) (08/17 0839) Pulse Rate:  [71-111] 85 (08/17 0839) Resp:  [13-20] 18 (08/17 0839) BP: (117-143)/(67-91) 140/86 (08/17 0839) SpO2:  [98 %-100 %] 100 % (08/17 0839) Weight:  [59.3 kg-63.5 kg] 59.3 kg (08/17 0050) Last BM Date: 04/06/18(Per patient)  Intake/Output from previous day: 08/16 0701 - 08/17 0700 In: 657.3 [P.O.:360; I.V.:297.3] Out: 750 [Urine:750] Intake/Output this shift: Total I/O In: 120 [P.O.:120] Out: -   General appearance: alert and cooperative Resp: dec BS on L Chest wall: left sided chest wall tenderness Cardio: regular rate and rhythm GI: soft, non-tender; bowel sounds normal; no masses,  no organomegaly Extremities: extremities normal, atraumatic, no cyanosis or edema Neurologic: Mental status: Alert, oriented, thought content appropriate  Lab Results: CBC  Recent Labs    04/07/18 1214 04/08/18 0252  WBC 11.2* 10.6*  HGB 12.2 11.4*  HCT 36.5 35.4*  PLT 285 245   BMET Recent Labs    04/07/18 1214 04/08/18 0252  NA 139 137  K 3.7 3.9  CL 103 104  CO2 28 25  GLUCOSE 132* 115*  BUN 15 8  CREATININE 0.91 0.61  CALCIUM 9.2 8.5*   Assessment/Plan: Fall from attic through ceiling Mult L rib FX with pneumothorax - PTX larger on CXR, will place 5514fr chest tube, add Robaxin, multimodal pain control FEN - lytes OK, adv diet and decrease IVF VTE - Lovenox Dispo - SDU, I spoke with her wife as well.   LOS: 1 day    Violeta GelinasBurke Kilian Schwartz, MD, MPH, FACS Trauma: [phone] General Surgery: [phone]  04/08/2018

## 2018-04-08 NOTE — Plan of Care (Signed)

## 2018-04-08 NOTE — Procedures (Signed)
Chest Tube Insertion Procedure Note  Indications:  Clinically significant Pneumothorax  Pre-operative Diagnosis: Pneumothorax  Post-operative Diagnosis: Pneumothorax  Procedure Details  Informed consent was obtained for the procedure, including sedation.  Risks of lung perforation, hemorrhage, arrhythmia, and adverse drug reaction were discussed.   After sterile skin prep, using standard technique, a 14 French tube was placed in the left anterior axillary line above nipple level using Seldinger technique. Tube attached to Pleurevac and sutured in place. Sterile dressing placed.  Findings: air  Estimated Blood Loss:  Minimal         Specimens:  None              Complications:  None; patient tolerated the procedure well.         Disposition: remains in SDU         Condition: stable  Vanessa GelinasBurke Jevin Camino, MD, MPH, FACS Trauma: [phone] General Surgery: [phone]

## 2018-04-09 ENCOUNTER — Inpatient Hospital Stay (HOSPITAL_COMMUNITY): Payer: No Typology Code available for payment source

## 2018-04-09 MED ORDER — DOCUSATE SODIUM 100 MG PO CAPS
100.0000 mg | ORAL_CAPSULE | Freq: Two times a day (BID) | ORAL | Status: DC
  Administered 2018-04-09 – 2018-04-10 (×3): 100 mg via ORAL
  Filled 2018-04-09 (×5): qty 1

## 2018-04-09 MED ORDER — POLYETHYLENE GLYCOL 3350 17 GM/SCOOP PO POWD
1.0000 | Freq: Once | ORAL | Status: AC
  Administered 2018-04-09: 255 g via ORAL
  Filled 2018-04-09: qty 255

## 2018-04-09 MED ORDER — HYDROMORPHONE HCL 1 MG/ML IJ SOLN
1.0000 mg | INTRAMUSCULAR | Status: DC | PRN
  Administered 2018-04-09 – 2018-04-12 (×14): 1 mg via INTRAVENOUS
  Filled 2018-04-09 (×14): qty 1

## 2018-04-09 NOTE — Progress Notes (Signed)
PCA d/c and wasted 12mg  with Deanna ArtisKeisha, RN in sink.

## 2018-04-09 NOTE — Evaluation (Signed)
Occupational Therapy Evaluation Patient Details Name: Vanessa Crosby MRN: [id number] DOB: Mar 20, 1961 Today's Date: 04/09/2018    History of Present Illness Pt is a 57 y.o. 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 No past medical history per chart.    Clinical Impression   PTA, pt was independent with ADL and functional mobility, working, and maintaining an active lifestyle. She currently requires overall min assist for dressing, bathing, and toileting tasks including transfers. She is limited by L rib pain and some dizziness which she attributes to medications today. Pt demonstrates excellent motivation to return to independence at this time. She has good family support. She would benefit from continued OT services while admitted to improve independence and safety with ADL and functional mobility prior to returning home but do not anticipate need for OT follow-up post-acute D/C.     Follow Up Recommendations  No OT follow up;Supervision/Assistance - 24 hour(initial 24 hour assistance)    Equipment Recommendations  3 in 1 bedside commode    Recommendations for Other Services       Precautions / Restrictions Precautions Precautions: Fall Precaution Comments: chest tube Restrictions Weight Bearing Restrictions: No      Mobility Bed Mobility Overal bed mobility: Needs Assistance Bed Mobility: Supine to Sit     Supine to sit: Min assist     General bed mobility comments: [facility]   Transfers Overall transfer level: Needs assistance Equipment used: 2 person hand held assist(wife assisting therapist per pt request) Transfers: Sit to/from Stand Sit to Stand: Min assist         General transfer comment: Min assist to power up to standing position.     Balance Overall balance assessment: Mild deficits observed, not formally tested                                          ADL either performed or assessed with clinical judgement   ADL Overall ADL's : Needs assistance/impaired Eating/Feeding: Set up;Sitting   Grooming: Set up;Sitting   Upper Body Bathing: Minimal assistance;Sitting   Lower Body Bathing: Minimal assistance;Sit to/from stand Lower Body Bathing Details (indicate cue type and reason): assist during standing for balance Upper Body Dressing : Minimal assistance;Sitting   Lower Body Dressing: Minimal assistance;Sit to/from stand Lower Body Dressing Details (indicate cue type and reason): Assist for managing pants in standing Toilet Transfer: Ambulation;Minimal assistance Toilet Transfer Details (indicate cue type and reason): min handheld assistance from wife and therapist Toileting- Clothing Manipulation and Hygiene: Minimal assistance;Sit to/from stand       Functional mobility during ADLs: Minimal assistance General ADL Comments: Pt able to complete figure 4 method for LB dressing tasks. She is limited by rib pain but able to manage this well.      Vision Patient Visual Report: No change from baseline Vision Assessment?: No apparent visual deficits     Perception     Praxis      Pertinent Vitals/Pain Pain Assessment: Faces Faces Pain Scale: Hurts little more Pain Location: L ribs Pain Descriptors / Indicators: Aching;Discomfort;Operative site guarding;Sore Pain Intervention(s): Limited activity within patient's tolerance;Monitored during session;Repositioned     Hand Dominance Right   Extremity/Trunk Assessment Upper Extremity Assessment Upper Extremity Assessment: Overall WFL for tasks assessed   Lower Extremity Assessment Lower Extremity Assessment:  Overall WFL for tasks assessed       Communication Communication Communication: No difficulties   Cognition Arousal/Alertness: Awake/alert Behavior During Therapy: WFL for tasks assessed/performed Overall Cognitive Status: Within  Functional Limits for tasks assessed                                     General Comments  Pt's wife present and engaged in session. Pt on 2L O2 with stable SpO2 during session.     Exercises     Shoulder Instructions      Home Living Family/patient expects to be discharged to:: Private residence Living Arrangements: Spouse/significant other Available Help at Discharge: Family;Available 24 hours/day(spouse and adult children) Type of Home: House Home Access: Stairs to enter [employer] Corporation[employer]trance Stairs-Number of Steps: 2 Entrance Stairs-Rails: Right;Left Home Layout: One level     Bathroom Shower/Tub: Walk-in shower         Home Equipment: Grab bars - tub/shower;Hand held shower head          Prior Functioning/Environment Level of Independence: Independent        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        OT Problem List: Decreased strength;Decreased range of motion;Decreased activity tolerance;Impaired balance (sitting and/or standing);Decreased safety awareness;Decreased knowledge of use of DME or AE;Decreased knowledge of precautions;Pain;Cardiopulmonary status limiting activity      OT Treatment/Interventions: Self-care/ADL training;Therapeutic exercise;DME and/or AE instruction;Manual therapy;Therapeutic activities;Patient/family education;Balance training    OT Goals(Current goals can be found in the care plan section) Acute Rehab OT Goals Patient Stated Goal: "to get back to mowing the lawn" OT Goal Formulation: With patient/family Time For Goal Achievement: 04/23/18 Potential to Achieve Goals: Good ADL Goals Pt Will Perform Grooming: Independently;standing Pt Will Perform Upper Body Dressing: Independently;sitting Pt Will Perform Lower Body Dressing: Independently;sit to/from stand Pt Will Transfer to Toilet: ambulating;bedside commode;regular height toilet;with modified independence(BSC over toilet) Pt Will Perform Toileting - Clothing Manipulation  and hygiene: Independently;sit to/from stand Pt Will Perform Tub/Shower Transfer: ambulating;Shower transfer;grab bars;with modified independence  OT Frequency: Min 2X/week   Barriers to D/C:            Co-evaluation              AM-PAC PT "6 Clicks" Daily Activity     Outcome Measure Help from another person eating meals?: None Help from another person taking care of personal grooming?: None Help from another person toileting, which includes using toliet, bedpan, or urinal?: A Little Help from another person bathing (including washing, rinsing, drying)?: A Little Help from another person to put on and taking off regular upper body clothing?: A Little Help from another person to put on and taking off regular lower body clothing?: A Little 6 Click Score: 20   End of Session Nurse Communication: Mobility status  Activity Tolerance: Patient tolerated treatment well Patient left: in chair;with call bell/phone within reach;with family/visitor present  OT Visit Diagnosis: Other abnormalities of gait and mobility (R26.89);Pain Pain - Right/Left: Left Pain - part of body: (ribs)                Time: [id number] OT Time Calculation (min): 25 min Charges:  OT General Charges $OT Visit: 1 Visit OT Evaluation $OT Eval Moderate Complexity: 1 Mod OT Treatments $Self Care/Home Management : 8-22 mins  Doristine Sectionharity A Ulisses Vondrak, MS OTR/L  Pager: [phone]   Lucelia Lacey A Charise Leinbach 04/09/2018, 8:33 AM

## 2018-04-09 NOTE — Progress Notes (Signed)
  Subjective: Up in chair, less pain  Objective: Vital signs in last 24 hours: Temp:  [98.1 F (36.7 C)-98.8 F (37.1 C)] 98.1 F (36.7 C) (08/18 0810) Pulse Rate:  [75-89] 75 (08/18 0810) Resp:  [11-25] 11 (08/18 0820) BP: (101-136)/(64-84) 101/64 (08/18 0810) SpO2:  [96 %-100 %] 100 % (08/18 0820) Last BM Date: 04/06/18(per patient)  Intake/Output from previous day: 08/17 0701 - 08/18 0700 In: 360 [P.O.:360] Out: 2570 [Urine:2500; Chest Tube:70] Intake/Output this shift: Total I/O In: 1946.7 [I.V.:1946.7] Out: 220 [Urine:200; Chest Tube:20]  General appearance: alert and cooperative Resp: clear to auscultation bilaterally and no air leak on CT Chest wall: left sided chest wall tenderness Cardio: regular rate and rhythm GI: soft, non-tender; bowel sounds normal; no masses,  no organomegaly Extremities: edema none  Lab Results: CBC  Recent Labs    04/07/18 1214 04/08/18 0252  WBC 11.2* 10.6*  HGB 12.2 11.4*  HCT 36.5 35.4*  PLT 285 245   BMET Recent Labs    04/07/18 1214 04/08/18 0252  NA 139 137  K 3.7 3.9  CL 103 104  CO2 28 25  GLUCOSE 132* 115*  BUN 15 8  CREATININE 0.91 0.61  CALCIUM 9.2 8.5*   Assessment/Plan: Fall from attic through ceiling Mult L rib FX with pneumothorax - PTX nearly resolved, continue CT to suction FEN - lytes OK, D/C PCA VTE - Lovenox Dispo - SDU, I spoke with her wife as well.   LOS: 2 days    Violeta GelinasBurke Areeba Sulser, MD, MPH, FACS Trauma: [phone] General Surgery: [phone]  8/18/2019Patient ID: Vanessa Crosby, female   DOB: 29-Sep-1960, 57 y.o.   MRN: [id number]

## 2018-04-09 NOTE — Evaluation (Signed)
Physical Therapy Evaluation Patient Details Name: Vanessa MusselConnie Crosby MRN: [id number] DOB: [birth year] Today's Date: 04/09/2018   History of Present Illness  Pt is a 57 y.o. 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 No past medical history per chart.     Clinical Impression  Pt admitted with above diagnosis. Pt currently with functional limitations due to the deficits listed below (see PT Problem List). PTA pt independent and active. On eval, she required min assist bed mobility, min assist transfers and min assist ambulation 60 feet with RW. Gait distance limited by pain. Pt is very motivated to participate in therapy. Pt will benefit from skilled PT to increase their independence and safety with mobility to allow discharge to the venue listed below.       Follow Up Recommendations No PT follow up;Supervision for mobility/OOB    Equipment Recommendations  Other (comment)(TBD with further mobility progress)    Recommendations for Other Services       Precautions / Restrictions Precautions Precautions: Fall;Other (comment) Precaution Comments: chest tube      Mobility  Bed Mobility               General bed mobility comments: Pt received in recliner. Min assist for bed mobility during OT eval this AM.   Transfers Overall transfer level: Needs assistance Equipment used: Rolling walker (2 wheeled) Transfers: Sit to/from Stand Sit to Stand: Min assist         General transfer comment: cues for hand placement, increased time and effort  Ambulation/Gait Ambulation/Gait assistance: Min assist Gait Distance (Feet): 60 Feet Assistive device: Rolling walker (2 wheeled) Gait Pattern/deviations: Step-through pattern;Decreased stride length Gait velocity: slow, guarded gait Gait velocity interpretation: <1.31 ft/sec, indicative of household ambulator General Gait Details: Pt ambulated  on RA with SpO2 93%. 2 L O2 replaced upon return to room with SpO2 99%. Cues for RW management and sequencing with turns.  Stairs            Wheelchair Mobility    Modified Rankin (Stroke Patients Only)       Balance Overall balance assessment: Mild deficits observed, not formally tested                                           Pertinent Vitals/Pain Pain Assessment: Faces Faces Pain Scale: Hurts even more Pain Location: L flank Pain Descriptors / Indicators: Aching;Discomfort;Operative site guarding;Sore Pain Intervention(s): Monitored during session;Repositioned;PCA encouraged    Home Living Family/patient expects to be discharged to:: Private residence Living Arrangements: Spouse/significant other Available Help at Discharge: Family;Available 24 hours/day Type of Home: House Home Access: Stairs to enter Entrance Stairs-Rails: Doctor, general practiceight;Left Entrance Stairs-Number of Steps: 2 Home Layout: One level Home Equipment: Grab bars - tub/shower;Hand held shower head      Prior Function Level of Independence: Independent         Comments: Working as an Occupational hygienistexterminator     Hand Dominance   Dominant Hand: Right    Extremity/Trunk Assessment   Upper Extremity Assessment Upper Extremity Assessment: Overall WFL for tasks assessed    Lower Extremity Assessment Lower Extremity Assessment: Overall WFL for tasks assessed    Cervical / Trunk Assessment Cervical / Trunk Assessment: Normal  Communication   Communication: No difficulties  Cognition Arousal/Alertness: Awake/alert Behavior During Therapy: WFL for tasks assessed/performed  Overall Cognitive Status: Within Functional Limits for tasks assessed                                        General Comments General comments (skin integrity, edema, etc.): Pt's wife present and engaged in session. She pushed IV pole for ambulation.     Exercises     Assessment/Plan    PT  Assessment Patient needs continued PT services  PT Problem List Decreased mobility;Cardiopulmonary status limiting activity;Decreased activity tolerance;Decreased knowledge of use of DME;Pain       PT Treatment Interventions DME instruction;Therapeutic activities;Gait training;Therapeutic exercise;Patient/family education;Balance training;Stair training;Functional mobility training    PT Goals (Current goals can be found in the Care Plan section)  Acute Rehab PT Goals Patient Stated Goal: get back to work PT Goal Formulation: With patient/family Time For Goal Achievement: 04/23/18 Potential to Achieve Goals: Good    Frequency Min 6X/week   Barriers to discharge        Co-evaluation               AM-PAC PT "6 Clicks" Daily Activity  Outcome Measure Difficulty turning over in bed (including adjusting bedclothes, sheets and blankets)?: A Lot Difficulty moving from lying on back to sitting on the side of the bed? : Unable Difficulty sitting down on and standing up from a chair with arms (e.g., wheelchair, bedside commode, etc,.)?: A Lot Help needed moving to and from a bed to chair (including a wheelchair)?: A Little Help needed walking in hospital room?: A Little Help needed climbing 3-5 steps with a railing? : A Lot 6 Click Score: 13    End of Session Equipment Utilized During Treatment: Gait belt Activity Tolerance: Patient tolerated treatment well Patient left: in chair;with call bell/phone within reach;with family/visitor present Nurse Communication: Mobility status PT Visit Diagnosis: Other abnormalities of gait and mobility (R26.89);Pain;Difficulty in walking, not elsewhere classified (R26.2) Pain - Right/Left: Left    Time: 1610-96041210-1241 PT Time Calculation (min) (ACUTE ONLY): 31 min   Charges:   PT Evaluation $PT Eval Moderate Complexity: 1 Mod PT Treatments $Gait Training: 8-22 mins        Aida RaiderWendy Garek Schuneman, PT  Office # [phone] Pager [phone]    Ilda FoilGarrow,  Korey Arroyo Rene 04/09/2018, 1:19 PM

## 2018-04-10 ENCOUNTER — Encounter (HOSPITAL_COMMUNITY): Payer: Self-pay | Admitting: *Deleted

## 2018-04-10 ENCOUNTER — Inpatient Hospital Stay (HOSPITAL_COMMUNITY): Payer: No Typology Code available for payment source

## 2018-04-10 MED ORDER — MAGNESIUM CITRATE PO SOLN
1.0000 | Freq: Once | ORAL | Status: AC
Start: 2018-04-10 — End: 2018-04-10
  Administered 2018-04-10: 1 via ORAL
  Filled 2018-04-10: qty 296

## 2018-04-10 MED ORDER — METHOCARBAMOL 500 MG PO TABS
750.0000 mg | ORAL_TABLET | Freq: Four times a day (QID) | ORAL | Status: DC | PRN
  Administered 2018-04-10 – 2018-04-12 (×6): 750 mg via ORAL
  Filled 2018-04-10 (×6): qty 2

## 2018-04-10 MED ORDER — ACETAMINOPHEN 500 MG PO TABS
1000.0000 mg | ORAL_TABLET | Freq: Four times a day (QID) | ORAL | Status: DC
  Administered 2018-04-10 – 2018-04-12 (×9): 1000 mg via ORAL
  Filled 2018-04-10 (×9): qty 2

## 2018-04-10 NOTE — Progress Notes (Addendum)
[facility] Progress Note     Subjective: CC:  Feels better today. Rates pain as 5/10. Pulling 1,000-1,100 cc on IS. Tolerating PO. Mobilized once in hallway yesterday.    Objective: Vital signs in last 24 hours: Temp:  [98.2 F (36.8 C)-98.9 F (37.2 C)] 98.2 F (36.8 C) (08/19 0745) Pulse Rate:  [78-98] 82 (08/19 0745) Resp:  [11-21] 17 (08/19 0745) BP: (110-134)/(58-86) 124/75 (08/19 0745) SpO2:  [92 %-100 %] 97 % (08/19 0745) Last BM Date: 04/06/18  Intake/Output from previous day: 08/18 0701 - 08/19 0700 In: 2346.3 [I.V.:2346.3] Out: 820 [Urine:350; Chest Tube:470] Intake/Output this shift: Total I/O In: 819.6 [I.V.:769.6; IV Piggyback:50] Out: -   PE: Gen:  Alert, NAD, pleasant and cooperative - up in chair Card:  Regular rate and rhythm, pedal pulses 2+ BL Pulm:  Normal effort, clear to auscultation bilaterally, chest tube dressing clean and dry. SS drainage in CT cannister. Abd: Soft, non-tender, non-distended, bowel sounds present Skin: warm and dry, no rashes  Psych: A&Ox3    Lab Results:  Recent Labs    04/07/18 1214 04/08/18 0252  WBC 11.2* 10.6*  HGB 12.2 11.4*  HCT 36.5 35.4*  PLT 285 245   BMET Recent Labs    04/07/18 1214 04/08/18 0252  NA 139 137  K 3.7 3.9  CL 103 104  CO2 28 25  GLUCOSE 132* 115*  BUN 15 8  CREATININE 0.91 0.61  CALCIUM 9.2 8.5*   CMP     Component Value Date/Time   NA 137 04/08/2018 0252   K 3.9 04/08/2018 0252   CL 104 04/08/2018 0252   CO2 25 04/08/2018 0252   GLUCOSE 115 (H) 04/08/2018 0252   BUN 8 04/08/2018 0252   CREATININE 0.61 04/08/2018 0252   CALCIUM 8.5 (L) 04/08/2018 0252   PROT 6.9 04/07/2018 1214   ALBUMIN 4.4 04/07/2018 1214   AST 21 04/07/2018 1214   ALT 14 04/07/2018 1214   ALKPHOS 38 04/07/2018 1214   BILITOT 0.7 04/07/2018 1214   GFRNONAA >60 04/08/2018 0252   GFRAA >60 04/08/2018 0252   Lipase     Component Value Date/Time   LIPASE 29 04/07/2018 1214    Studies/Results: Dg Chest Port 1 View  Result Date: 04/09/2018 CLINICAL DATA:  Left-sided pneumothorax. EXAM: PORTABLE CHEST 1 VIEW COMPARISON:  04/08/2018 FINDINGS: Left basilar chest tube unchanged. Lungs are adequately inflated with stable small left apical pneumothorax. Stable hazy opacification over the left base likely small effusion/atelectasis. Cardiomediastinal silhouette is within normal. There are multiple left-sided rib fractures unchanged. Remainder of the exam is unchanged. IMPRESSION: Stable mild left base opacification likely small effusion with atelectasis. Left-sided chest tube remains in place with stable small left apical pneumothorax. Electronically Signed   By: Elberta Fortisaniel  Boyle M.D.   On: 04/09/2018 07:23   Dg Chest Port 1 View  Result Date: 04/08/2018 CLINICAL DATA:  Chest tube placement. EXAM: PORTABLE CHEST 1 VIEW COMPARISON:  04/08/2018 FINDINGS: A LEFT thoracostomy tube is now noted. There is no evidence of pneumothorax. Cardiomediastinal silhouette is unremarkable. Mild LEFT basilar atelectasis and multiple LEFT rib fractures are again noted. No other significant change IMPRESSION: LEFT thoracostomy tube placement.  No evidence of pneumothorax. LEFT rib fractures and LEFT basilar atelectasis again noted. Electronically Signed   By: Harmon PierJeffrey  Hu M.D.   On: 04/08/2018 11:30    Anti-infectives: Anti-infectives (From admission, onward)   None     Assessment/Plan Fall from attic through ceiling Mult L rib FX with  pneumothorax - PTX improved, tiny apical, place CT to WS  FEN -Reg diet, saline lock IV, schedule tylenol, PRN PO robaxin, continue PRN oxy, wean dilaudid  VTE - Lovenox Dispo - SDU   LOS: 3 days    Hosie SpangleElizabeth Jupiter Kabir, [facility] Pager: [phone]

## 2018-04-10 NOTE — Progress Notes (Signed)
CSW completed SBIRT with patient. CSW signing off.   Abigail ButtsSusan Xanthe Couillard, LCSWA [phone]

## 2018-04-10 NOTE — Progress Notes (Signed)
Occupational Therapy Treatment Patient Details Name: Vanessa MusselConnie Crosby MRN: [id number] DOB: 05-06-1961 Today's Date: 04/10/2018    History of present illness Pt is a 57 y.o. 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 No past medical history per chart.    OT comments  Pt demonstrating good progress toward OT goals. She was able to complete functional toilet transfers with overall supervision for safety. She is able to complete standing grooming and toileting hygiene tasks with supervision today as well. Pt progressing with activity tolerance for ADL participation as well. D/C recommendation remains appropriate. OT will continue to follow while admitted.    Follow Up Recommendations  No OT follow up;Supervision/Assistance - 24 hour(initial 24 hour assistance)    Equipment Recommendations  3 in 1 bedside commode    Recommendations for Other Services      Precautions / Restrictions Precautions Precautions: Fall;Other (comment) Precaution Comments: chest tube Restrictions Weight Bearing Restrictions: No       Mobility Bed Mobility               General bed mobility comments: OOB in chair   Transfers Overall transfer level: Needs assistance Equipment used: Rolling walker (2 wheeled) Transfers: Sit to/from Stand Sit to Stand: Supervision         General transfer comment: Supervision for safety.     Balance Overall balance assessment: Mild deficits observed, not formally tested                                         ADL either performed or assessed with clinical judgement   ADL Overall ADL's : Needs assistance/impaired     Grooming: Supervision/safety;Standing                   Toilet Transfer: Supervision/safety;Ambulation;RW;BSC Toilet Transfer Details (indicate cue type and reason): supervision for safety; BSC over toilet Toileting- Clothing  Manipulation and Hygiene: Supervision/safety;Sit to/from stand       Functional mobility during ADLs: Supervision/safety;Rolling walker General ADL Comments: Supervision for safety with RW      Vision   Vision Assessment?: No apparent visual deficits   Perception     Praxis      Cognition Arousal/Alertness: Awake/alert Behavior During Therapy: WFL for tasks assessed/performed Overall Cognitive Status: Within Functional Limits for tasks assessed                                          Exercises     Shoulder Instructions       General Comments SpO2 stable on RA throughout    Pertinent Vitals/ Pain       Pain Assessment: 0-10 Pain Score: 8  Pain Location: chest tube insertion site Pain Descriptors / Indicators: Aching;Discomfort;Operative site guarding;Sore Pain Intervention(s): Limited activity within patient's tolerance;Monitored during session;Repositioned  Home Living                                          Prior Functioning/Environment              Frequency  Min 2X/week        Progress Toward Goals  OT Goals(current goals can now be found in the care plan section)  Progress towards OT goals: Progressing toward goals  Acute Rehab OT Goals Patient Stated Goal: get back to work OT Goal Formulation: With patient/family Time For Goal Achievement: 04/23/18 Potential to Achieve Goals: Good  Plan Discharge plan remains appropriate    Co-evaluation                 AM-PAC PT "6 Clicks" Daily Activity     Outcome Measure   Help from another person eating meals?: None Help from another person taking care of personal grooming?: None Help from another person toileting, which includes using toliet, bedpan, or urinal?: None Help from another person bathing (including washing, rinsing, drying)?: A Little Help from another person to put on and taking off regular upper body clothing?: A Little Help from another  person to put on and taking off regular lower body clothing?: A Little 6 Click Score: 21    End of Session Equipment Utilized During Treatment: Gait belt;Rolling walker  OT Visit Diagnosis: Other abnormalities of gait and mobility (R26.89);Pain Pain - Right/Left: Left Pain - part of body: (ribs)   Activity Tolerance Patient tolerated treatment well   Patient Left in chair;with call bell/phone within reach;with family/visitor present   Nurse Communication Mobility status        Time: 1610-96041146-1201 OT Time Calculation (min): 15 min  Charges: OT General Charges $OT Visit: 1 Visit OT Treatments $Self Care/Home Management : 8-22 mins  Vanessa Sectionharity A Vanessa Martelle, MS OTR/L  Pager: [phone]    Vanessa Crosby 04/10/2018, 2:37 PM

## 2018-04-10 NOTE — Progress Notes (Signed)
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 No past medical history per chart.     PT Comments    Patient seen for activity progression. Progressing well with mobility this session and overall in good spirits. Educated patient on continued frequent mobility with staff during hospital course. Will continue to see and progress as tolerated.    Follow Up Recommendations  No PT follow up;Supervision for mobility/OOB     Equipment Recommendations  Other (comment)(TBD with further mobility progress)    Recommendations for Other Services       Precautions / Restrictions Precautions Precautions: Fall;Other (comment) Precaution Comments: chest tube Restrictions Weight Bearing Restrictions: No    Mobility  Bed Mobility               General bed mobility comments: OOB in chair   Transfers Overall transfer level: Needs assistance Equipment used: Rolling walker (2 wheeled) Transfers: Sit to/from Stand Sit to Stand: Supervision         General transfer comment: Supervision for safety.   Ambulation/Gait Ambulation/Gait assistance: Supervision Gait Distance (Feet): 210 Feet Assistive device: Rolling walker (2 wheeled) Gait Pattern/deviations: Step-through pattern;Decreased stride length Gait velocity: improved cadence Gait velocity interpretation: 1.31 - 2.62 ft/sec, indicative of limited community ambulator General Gait Details: steady with ambulation, improvements in speed noted.    Stairs             Wheelchair Mobility    Modified Rankin (Stroke Patients Only)       Balance Overall balance assessment: Mild deficits observed, not formally tested                                           Cognition Arousal/Alertness: Awake/alert Behavior During Therapy: WFL for tasks assessed/performed Overall Cognitive Status: Within Functional Limits for tasks assessed                                        Exercises      General Comments General comments (skin integrity, edema, etc.): VSS throughout      Pertinent Vitals/Pain Pain Assessment: 0-10 Pain Score: 6  Pain Location: chest tube insertion site Pain Descriptors / Indicators: Aching;Discomfort;Operative site guarding;Sore Pain Intervention(s): Monitored during session    Home Living                      Prior Function            PT Goals (current goals can now be found in the care plan section) Acute Rehab PT Goals Patient Stated Goal: get back to work PT Goal Formulation: With patient/family Time For Goal Achievement: 04/23/18 Potential to Achieve Goals: Good Progress towards PT goals: Progressing toward goals    Frequency    Min 6X/week      PT Plan Current plan remains appropriate    Co-evaluation              AM-PAC PT "6 Clicks" Daily Activity  Outcome Measure  Difficulty turning over in bed (including adjusting bedclothes, sheets and blankets)?: A Little  Difficulty moving from lying on back to sitting on the side of the bed? : A Little Difficulty sitting down on and standing up from a chair with arms (e.g., wheelchair, bedside commode, etc,.)?: A Little Help needed moving to and from a bed to chair (including a wheelchair)?: A Little Help needed walking in hospital room?: A Little Help needed climbing 3-5 steps with a railing? : A Lot 6 Click Score: 17    End of Session Equipment Utilized During Treatment: Gait belt Activity Tolerance: Patient tolerated treatment well Patient left: in chair;with call bell/phone within reach;with family/visitor present Nurse Communication: Mobility status PT Visit Diagnosis: Other abnormalities of gait and mobility  (R26.89);Pain;Difficulty in walking, not elsewhere classified (R26.2) Pain - Right/Left: Left     Time: [id number] PT Time Calculation (min) (ACUTE ONLY): 15 min  Charges:  $Gait Training: 8-22 mins                     Charlotte Crumbevon Ahmir Bracken, PT DPT  Board Certified Neurologic Specialist [phone]    Fabio AsaDevon J Pierce Biagini 04/10/2018, 4:24 PM

## 2018-04-11 ENCOUNTER — Inpatient Hospital Stay (HOSPITAL_COMMUNITY): Payer: No Typology Code available for payment source

## 2018-04-11 ENCOUNTER — Encounter (HOSPITAL_COMMUNITY): Payer: Self-pay

## 2018-04-11 NOTE — Progress Notes (Signed)
[facility] Progress Note     Subjective: CC:  Reports 7-8 BMs last night after drinking laxative. Pain continues to improve. Tolerating PO. Reports pulling ~1300 on IS.  Objective: Vital signs in last 24 hours: Temp:  [97.7 F (36.5 C)-98.3 F (36.8 C)] 98 F (36.7 C) (08/20 0521) Pulse Rate:  [71-88] 85 (08/20 0000) Resp:  [14-21] 16 (08/20 0621) BP: (100-136)/(62-77) 109/64 (08/20 0521) SpO2:  [93 %-99 %] 98 % (08/20 0521) Last BM Date: 04/10/18  Intake/Output from previous day: 08/19 0701 - 08/20 0700 In: 1539.6 [P.O.:720; I.V.:769.6; IV Piggyback:50] Out: 270 [Chest Tube:270] Intake/Output this shift: No intake/output data recorded.  PE: Gen:  Alert, NAD, pleasant Card:  Regular rate and rhythm, pedal pulses 2+ BL Pulm:  Normal effort, clear to auscultation bilaterally, CT w/ no air leak. On WS. Abd: Soft, non-tender, non-distended, bowel sounds present/I Skin: warm and dry, no rashes  Psych: A&Ox3   Lab Results:  CMP     Component Value Date/Time   NA 137 04/08/2018 0252   K 3.9 04/08/2018 0252   CL 104 04/08/2018 0252   CO2 25 04/08/2018 0252   GLUCOSE 115 (H) 04/08/2018 0252   BUN 8 04/08/2018 0252   CREATININE 0.61 04/08/2018 0252   CALCIUM 8.5 (L) 04/08/2018 0252   PROT 6.9 04/07/2018 1214   ALBUMIN 4.4 04/07/2018 1214   AST 21 04/07/2018 1214   ALT 14 04/07/2018 1214   ALKPHOS 38 04/07/2018 1214   BILITOT 0.7 04/07/2018 1214   GFRNONAA >60 04/08/2018 0252   GFRAA >60 04/08/2018 0252   Lipase     Component Value Date/Time   LIPASE 29 04/07/2018 1214   Studies/Results: Dg Chest Port 1 View  Result Date: 04/10/2018 CLINICAL DATA:  Left pneumothorax, chest tube EXAM: PORTABLE CHEST 1 VIEW COMPARISON:  04/09/2018 FINDINGS: Left chest tube remains in place, unchanged. Small residual left apical pneumothorax is stable. Bibasilar atelectasis and small left effusion. Right lung clear. No acute bony abnormality. Heart is normal size.  IMPRESSION: Left chest tube remains in place with small residual left apical pneumothorax, stable. Left base atelectasis and small effusion. Electronically Signed   By: Charlett NoseKevin  Dover M.D.   On: 04/10/2018 08:38    Anti-infectives: Anti-infectives (From admission, onward)   None     Assessment/Plan Fall from attic through ceiling Mult L rib FX with pneumothorax- PTX improved, CT [facility] 270cc[facility]. FEN-Reg diet, saline lock IV, scheduled tylenol, PRN robaxin, PRN oxy, wean dilaudid  VTE- Lovenox Dispo- SDU   LOS: 4 days    Hosie SpangleElizabeth Marni Franzoni, [facility] Pager: [phone]

## 2018-04-11 NOTE — Care Management Note (Signed)
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  PTA, pt independent, lives with spouse.                   Action/Plan: PT/OT recommending no OP follow up.  Spouse able to provide care at discharge.    Vladimir Creeksavid Cook with [facility] [employer] is following for discharge needs: Phone: [phone] Fax:  [phone]  [facility], per his request.    Expected Discharge Date:                  Expected Discharge Plan:  Home/Self Care  In-House Referral:  Clinical Social Work  Discharge planning Services  CM Consult  Post Acute Care Choice:    Choice offered to:     DME Arranged:    DME Agency:     HH Arranged:    HH Agency:     Status of Service:  In process, will continue to follow  If discussed at Long Length of Stay Meetings, dates discussed:    Additional Comments:  Quintella BatonJulie W. Ramey Ketcherside, RN, BSN  Trauma/Neuro ICU Case Manager [phone]

## 2018-04-11 NOTE — Progress Notes (Signed)
Physical Therapy Treatment Patient Details Name: Vanessa MusselConnie Blase MRN: [id number] DOB: [birth year] Today's Date: 04/11/2018    History of Present Illness Pt is a 57 y.o. 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 No past medical history per chart.     PT Comments    Pt improving steadily, limited mostly by CT.  Today, emphasis was progressive ambulation and balance without the use of the RW.     Follow Up Recommendations  No PT follow up;Supervision for mobility/OOB     Equipment Recommendations  None recommended by PT    Recommendations for Other Services       Precautions / Restrictions Precautions Precautions: Fall Precaution Comments: chest tube    Mobility  Bed Mobility Overal bed mobility: Needs Assistance Bed Mobility: Sidelying to Sit;Sit to Sidelying;Rolling Rolling: Supervision Sidelying to sit: Supervision     Sit to sidelying: Supervision General bed mobility comments: cues for technique and pt demonstrated slowly, but safely  Transfers Overall transfer level: Needs assistance Equipment used: Rolling walker (2 wheeled);None Transfers: Sit to/from Stand Sit to Stand: Supervision            Ambulation/Gait Ambulation/Gait assistance: Supervision   Assistive device: None;Rolling walker (2 wheeled) Gait Pattern/deviations: Step-through pattern Gait velocity: able to increased speed to command Gait velocity interpretation: 1.31 - 2.62 ft/sec, indicative of limited community ambulator General Gait Details: Warm up with the RW and then pt carried her chest tube, increased speed significantly to cuing.   Stairs             Wheelchair Mobility    Modified Rankin (Stroke Patients Only)       Balance Overall balance assessment: Needs assistance   Sitting balance-Leahy Scale: Good       Standing balance-Leahy Scale: Good                              Cognition Arousal/Alertness: Awake/alert Behavior During Therapy: WFL for tasks assessed/performed Overall Cognitive Status: Within Functional Limits for tasks assessed                                        Exercises      General Comments General comments (skin integrity, edema, etc.): vss      Pertinent Vitals/Pain Pain Assessment: Faces Faces Pain Scale: Hurts little more Pain Location: chest tube insertion site Pain Descriptors / Indicators: Aching;Discomfort;Operative site guarding;Sore Pain Intervention(s): Monitored during session    Home Living                      Prior Function            PT Goals (current goals can now be found in the care plan section) Acute Rehab PT Goals Patient Stated Goal: get back to work PT Goal Formulation: With patient/family Time For Goal Achievement: 04/23/18 Potential to Achieve Goals: Good Progress towards PT goals: Progressing toward goals    Frequency    Min 6X/week      PT Plan Current plan remains appropriate    Co-evaluation              AM-PAC PT "6 Clicks" Daily Activity  Outcome Measure  Difficulty turning over in bed (including adjusting bedclothes, sheets and blankets)?: A Little Difficulty  moving from lying on back to sitting on the side of the bed? : A Little Difficulty sitting down on and standing up from a chair with arms (e.g., wheelchair, bedside commode, etc,.)?: A Little Help needed moving to and from a bed to chair (including a wheelchair)?: A Little Help needed walking in hospital room?: A Little Help needed climbing 3-5 steps with a railing? : A Little 6 Click Score: 18    End of Session   Activity Tolerance: Patient tolerated treatment well Patient left: in chair;with call bell/phone within reach;with family/visitor present Nurse Communication: Mobility status PT Visit Diagnosis: Other abnormalities of gait and mobility (R26.89);Pain Pain -  Right/Left: Left     Time:  -     Charges:  $Gait Training: 8-22 mins                     04/11/2018  Torreon BingKen [facility] Violet, PT [phone]  (pager)   Eliseo GumKenneth V Royce Sciara 04/11/2018, 6:03 PM

## 2018-04-12 ENCOUNTER — Inpatient Hospital Stay (HOSPITAL_COMMUNITY): Payer: No Typology Code available for payment source

## 2018-04-12 MED ORDER — ACETAMINOPHEN 500 MG PO TABS: 1000.0000 mg | ORAL_TABLET | Freq: Four times a day (QID) | ORAL | 0 refills | Status: AC | PRN

## 2018-04-12 MED ORDER — OXYCODONE HCL 10 MG PO TABS: 10.0000 mg | ORAL_TABLET | ORAL | 0 refills | Status: AC | PRN

## 2018-04-12 MED ORDER — HYDROMORPHONE HCL 1 MG/ML IJ SOLN: 0.5000 mg | Freq: Two times a day (BID) | INTRAMUSCULAR | Status: DC | PRN

## 2018-04-12 NOTE — Discharge Instructions (Signed)
Rib Fracture ° °A rib fracture is a break or crack in one of the bones of the ribs. The ribs are a group of long, curved bones that wrap around your chest and attach to your spine. They protect your lungs and other organs in the chest cavity. A broken or cracked rib is often painful, but most do not cause other problems. Most rib fractures heal on their own over time. However, rib fractures can be more serious if multiple ribs are broken or if broken ribs move out of place and push against other structures. °What are the causes? °· A direct blow to the chest. For example, this could happen during contact sports, a car accident, or a fall against a hard object. °· Repetitive movements with high force, such as pitching a baseball or having severe coughing spells. °What are the signs or symptoms? °· Pain when you breathe in or cough. °· Pain when someone presses on the injured area. °How is this diagnosed? °Your caregiver will perform a physical exam. Various imaging tests may be ordered to confirm the diagnosis and to look for related injuries. These tests may include a chest X-ray, computed tomography (CT), magnetic resonance imaging (MRI), or a bone scan. °How is this treated? °Rib fractures usually heal on their own in 1-3 months. The longer healing period is often associated with a continued cough or other aggravating activities. During the healing period, pain control is very important. Medication is usually given to control pain. Hospitalization or surgery may be needed for more severe injuries, such as those in which multiple ribs are broken or the ribs have moved out of place. °Follow these instructions at home: °· Avoid strenuous activity and any activities or movements that cause pain. Be careful during activities and avoid bumping the injured rib. °· Gradually increase activity as directed by your caregiver. °· Only take over-the-counter or prescription medications as directed by your caregiver. Do not take  other medications without asking your caregiver first. °· Apply ice to the injured area for the first 1-2 days after you have been treated or as directed by your caregiver. Applying ice helps to reduce inflammation and pain. °? Put ice in a plastic bag. °? Place a towel between your skin and the bag. °? Leave the ice on for 15-20 minutes at a time, every 2 hours while you are awake. °· Perform deep breathing as directed by your caregiver. This will help prevent pneumonia, which is a common complication of a broken rib. Your caregiver may instruct you to: °? Take deep breaths several times a day. °? Try to cough several times a day, holding a pillow against the injured area. °? Use a device called an incentive spirometer to practice deep breathing several times a day. °· Drink enough fluids to keep your urine clear or pale yellow. This will help you avoid constipation. °· Do not wear a rib belt or binder. These restrict breathing, which can lead to pneumonia. °Get help right away if: °· You have a fever. °· You have difficulty breathing or shortness of breath. °· You develop a continual cough, or you cough up thick or bloody sputum. °· You feel sick to your stomach (nausea), throw up (vomit), or have abdominal pain. °· You have worsening pain not controlled with medications. °This information is not intended to replace advice given to you by your health care provider. Make sure you discuss any questions you have with your health care provider. °Document Released: 08/09/2005   Document Revised: 01/15/2016 Document Reviewed: 10/11/2012 °Elsevier Interactive Patient Education © 2018 [employer] ° °

## 2018-04-12 NOTE — Progress Notes (Signed)
[facility] Progress Note     Subjective: CC:  Pain continues to improve. Pulling 1500 cc on IS. Bowel movements have slowed down.  Objective: Vital signs in last 24 hours: Temp:  [97.5 F (36.4 C)-98.2 F (36.8 C)] 97.8 F (36.6 C) (08/21 0310) Pulse Rate:  [78-87] 78 (08/21 0310) Resp:  [12-16] 12 (08/21 0310) BP: (103-149)/(62-78) 149/75 (08/21 0310) SpO2:  [95 %-100 %] 96 % (08/21 0310) Last BM Date: 04/11/18  Intake/Output from previous day: 08/20 0701 - 08/21 0700 In: 700 [P.O.:700] Out: 20 [Chest Tube:20] Intake/Output this shift: No intake/output data recorded.  PE: Gen:  Alert, NAD, pleasant Card:  Regular rate and rhythm, pedal pulses 2+ BL Pulm:  Normal effort, crackles left lung base otherwise CTAB, CT w/ no air leak. On WS. Abd: Soft, non-tender, non-distended, bowel sounds present/I Skin: warm and dry, no rashes  Psych: A&Ox3   Lab Results:  CMP     Component Value Date/Time   NA 137 04/08/2018 0252   K 3.9 04/08/2018 0252   CL 104 04/08/2018 0252   CO2 25 04/08/2018 0252   GLUCOSE 115 (H) 04/08/2018 0252   BUN 8 04/08/2018 0252   CREATININE 0.61 04/08/2018 0252   CALCIUM 8.5 (L) 04/08/2018 0252   PROT 6.9 04/07/2018 1214   ALBUMIN 4.4 04/07/2018 1214   AST 21 04/07/2018 1214   ALT 14 04/07/2018 1214   ALKPHOS 38 04/07/2018 1214   BILITOT 0.7 04/07/2018 1214   GFRNONAA >60 04/08/2018 0252   GFRAA >60 04/08/2018 0252   Lipase     Component Value Date/Time   LIPASE 29 04/07/2018 1214       Studies/Results: Dg Chest Port 1 View  Result Date: 04/11/2018 CLINICAL DATA:  Hemo pneumothorax with chest tube in place EXAM: PORTABLE CHEST 1 VIEW COMPARISON:  Jan 08, 2018 FINDINGS: Chest tube remains in place on the left. There is a small pleural effusion on the left and a small apical pneumothorax. The pneumothorax is smaller compared to 1 day prior. No tension component. There is mild left base atelectasis. Lungs elsewhere clear.  Heart size and pulmonary vascularity are normal. No adenopathy. There is aortic atherosclerosis. There are stable rib fractures on the left. No new fracture evident. IMPRESSION: Chest tube remains on the left. Small hydropneumothorax on the left with pneumothorax slightly smaller compared to 1 day prior. No tension component. Left base atelectasis noted. Right lung clear. Stable cardiac silhouette. There is aortic atherosclerosis. Stable rib fractures on the left. Aortic Atherosclerosis (ICD10-I70.0). Electronically Signed   By: Bretta BangWilliam  Woodruff III M.D.   On: 04/11/2018 08:10    Anti-infectives: Anti-infectives (From admission, onward)   None     Assessment/Plan Fall from attic through ceiling Mult L rib FX with pneumothorax-  tiny residual apical PTX, CT w/ 20cc/24h, remove CT today and get follow up chest CT. FEN-Reg diet,saline lock IV,scheduled tylenol,PRN robaxin,PRN oxy, wean dilaudid VTE- Lovenox Dispo- SDU, PM discharge if remains stable s/p CT removal   LOS: 5 days    Vanessa [facility], [facility] Pager: [phone]

## 2018-04-12 NOTE — Discharge Summary (Signed)
[facility] Discharge Summary   Patient ID: Vanessa [facility] MRN: [id number] DOB/AGE: [birth year] 57 y.o.  Admit date: 04/07/2018 Discharge date: 04/12/2018  Discharge Diagnosis Patient Active Problem List   Diagnosis Date Noted   Fall through ceiling     Left pneumothorax    . Multiple rib fractures, left  04/07/2018   Imaging: Dg Chest Port 1 View  Result Date: 04/12/2018 CLINICAL DATA:  Hemopneumothorax, chest tube EXAM: PORTABLE CHEST 1 VIEW COMPARISON:  04/11/2018 FINDINGS: Pigtail catheter overlying the left lung base unchanged in position. Small left apical pneumothorax unchanged. Small left pleural effusion unchanged. Progression of mild left lower lobe atelectasis. Multiple left rib fractures Right lung remains clear IMPRESSION: Small left apical pneumothorax unchanged. Small left effusion unchanged. Progressive left lower lobe atelectasis. Electronically Signed   By: Marlan Palauharles  Clark M.D.   On: 04/12/2018 08:18   Dg Chest Port 1 View  Result Date: 04/11/2018 CLINICAL DATA:  Hemo pneumothorax with chest tube in place EXAM: PORTABLE CHEST 1 VIEW COMPARISON:  Jan 08, 2018 FINDINGS: Chest tube remains in place on the left. There is a small pleural effusion on the left and a small apical pneumothorax. The pneumothorax is smaller compared to 1 day prior. No tension component. There is mild left base atelectasis. Lungs elsewhere clear. Heart size and pulmonary vascularity are normal. No adenopathy. There is aortic atherosclerosis. There are stable rib fractures on the left. No new fracture evident. IMPRESSION: Chest tube remains on the left. Small hydropneumothorax on the left with pneumothorax slightly smaller compared to 1 day prior. No tension component. Left base atelectasis noted. Right lung clear. Stable cardiac silhouette. There is aortic atherosclerosis. Stable rib fractures on the left. Aortic Atherosclerosis (ICD10-I70.0). Electronically Signed   By: Bretta BangWilliam  Woodruff  III M.D.   On: 04/11/2018 08:10    Procedures Dr. Violeta GelinasBurke Thompson (04/08/18) - chest tube insertion   Hospital Course:  57 y/o female who was [facility] [place] after she fell through the ceiling at work, landing on her left flank/back. Workup significant for multiple left-sided rib fractures and small apical pneumothorax. Patient was admitted for observation and multimodal pain control. On hospital day #1 the pneumothorax was larger on chest X-ray and patient was slightly short of breath. A pigtail chest tube was placed as above and pneumothorax improved. On 04/12/18 the patients chest tube was removed. On 04/12/18 the patients vitals were stable, pain improving, tolerating PO, mobilizing, and medically stable for discharge. She will follow up as below and knows to call with questions/concerns. Work note provided.    Allergies as of 04/12/2018      Reactions   Penicillins Rash      Medication List    STOP taking these medications   HYDROcodone-acetaminophen 7.5-325 MG tablet Commonly known as:  NORCO     TAKE these medications   acetaminophen 500 MG tablet Commonly known as:  TYLENOL Take 2 tablets (1,000 mg total) by mouth every 6 (six) hours as needed.   albuterol 108 (90 Base) MCG/ACT inhaler Commonly known as:  PROVENTIL HFA;VENTOLIN HFA Inhale 2 puffs into the lungs every 6 (six) hours as needed for wheezing or shortness of breath.   ALPRAZolam 0.5 MG tablet Commonly known as:  XANAX Take 0.5 mg by mouth at bedtime as needed for anxiety.   meloxicam 7.5 MG tablet Commonly known as:  MOBIC Take 7.5 mg by mouth daily as needed for pain (migraine).   omeprazole 40 MG capsule Commonly  known as:  PRILOSEC Take 40 mg by mouth daily.   Oxycodone HCl 10 MG Tabs Take 1 tablet (10 mg total) by mouth every 4 (four) hours as needed for severe pain.   Senna 8.6 MG Caps Take 8.6 mg by mouth daily.        Follow-up Information    [facility]  [place]. Go on 04/25/2018.   Why:  at 10 AM for follow up from recent rib fractures and chest tube. please get a chest X-ray prior to your appointment. Please arrive 30 minutes early. Contact information: [address] [place] [id number] [phone]          Signed: Hosie Spanglelizabeth Aicia Babinski, [facility] 04/12/2018, 1:09 PM

## 2018-04-12 NOTE — Progress Notes (Signed)
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 No past medical history per chart.     PT Comments    Pt in chair on arrival with friends visiting. Pt able to use restroom, ambulate 300 ft w/ no AD, and ascend/descend two stairs w/ rail on right with supervision for lines. Pt reports tenderness at site of previous chest tube but feels functioning is close to baseline. Some lightheadedness with gait, vitals checked and stable. Pt reports this has happened before prior to injury. Pt encouraged to take 5-10 seconds each time changes position to gather bearings and assume no lightheadedness prior to transfer or ambulation.      Follow Up Recommendations  No PT follow up;Supervision for mobility/OOB     Equipment Recommendations  None recommended by PT    Recommendations for Other Services       Precautions / Restrictions Precautions Precautions: Fall Precaution Comments: chest tube removed today  Restrictions Weight Bearing Restrictions: No    Mobility  Bed Mobility               General bed mobility comments: OOB in recliner on arrival   Transfers Overall transfer level: Independent Equipment used: None Transfers: Sit to/from Stand Sit to Stand: Independent         General transfer comment: +1 to manage lines   Ambulation/Gait Ambulation/Gait assistance: Supervision Gait Distance (Feet): 300 Feet Assistive device: None Gait Pattern/deviations: WFL(Within Functional Limits) Gait velocity: able to increase speed on command  Gait velocity interpretation: >4.37 ft/sec, indicative of normal walking speed General Gait Details: able to tolerate challenge, mild lightheadedness reported with gait, VSS,     Stairs Stairs: Yes Stairs assistance: Supervision Stair Management: One rail Right Number of Stairs: 2 General stair comments: supervision for lines    Wheelchair Mobility    Modified Rankin (Stroke Patients Only)       Balance Overall balance assessment: Independent Sitting-balance support: Feet supported;No upper extremity supported Sitting balance-Leahy Scale: Good     Standing balance support: No upper extremity supported;During functional activity Standing balance-Leahy Scale: Good                   Standardized Balance Assessment Standardized Balance Assessment : Dynamic Gait Index   Dynamic Gait Index Level Surface: Normal Change in Gait Speed: Normal Gait with Horizontal Head Turns: Normal Gait with Vertical Head Turns: Normal Gait and Pivot Turn: Normal Step Over Obstacle: Mild Impairment Step Around Obstacles: Normal Steps: Mild Impairment Total Score: 22      Cognition Arousal/Alertness: Awake/alert Behavior During Therapy: WFL for tasks assessed/performed Overall Cognitive Status: Within Functional Limits for tasks assessed                                        Exercises      General Comments General comments (skin integrity, edema, etc.): VSS      Pertinent Vitals/Pain Pain Assessment: Faces Faces Pain Scale: Hurts a little bit Pain Location: chest tube insertion site Pain Descriptors / Indicators: Sore;Other (Comment);Tightness(pulling ) Pain Intervention(s): Limited activity within patient's tolerance;Monitored during session;Patient requesting pain meds-RN notified    Home Living  Prior Function            PT Goals (current goals can now be found in the care plan section) Progress towards PT goals: Progressing toward goals    Frequency    Min 6X/week      PT Plan Current plan remains appropriate    Co-evaluation              AM-PAC PT "6 Clicks" Daily  Activity  Outcome Measure  Difficulty turning over in bed (including adjusting bedclothes, sheets and blankets)?: A Little Difficulty moving from lying on back to sitting on the side of the bed? : A Little Difficulty sitting down on and standing up from a chair with arms (e.g., wheelchair, bedside commode, etc,.)?: None Help needed moving to and from a bed to chair (including a wheelchair)?: A Little Help needed walking in hospital room?: None Help needed climbing 3-5 steps with a railing? : None 6 Click Score: 21    End of Session Equipment Utilized During Treatment: Gait belt Activity Tolerance: Patient tolerated treatment well Patient left: in chair;with family/visitor present;with call bell/phone within reach Nurse Communication: Mobility status;Patient requests pain meds PT Visit Diagnosis: Other abnormalities of gait and mobility (R26.89);Pain Pain - Right/Left: Left     Time: [id number] PT Time Calculation (min) (ACUTE ONLY): 23 min  Charges:  $Gait Training: 8-22 mins $Therapeutic Activity: 8-22 mins                     Vanessa [facility], [place]  Acute Rehab [phone]    Vanessa LairKara Swannie [facility] 04/12/2018, 5:45 PM

## 2018-04-12 NOTE — Progress Notes (Addendum)
Occupational Therapy Treatment and Discharge Patient Details Name: Vanessa Crosby MRN: [id number] DOB: [birth year] Today's Date: 04/12/2018    History of present illness Pt is a 57 y.o. 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 No past medical history per chart.    OT comments  Pt demonstrating good progress and has met all OT goals at this time. She is able to complete ADL and functional mobility for ADL transfers independently at this time. Educated pt concerning safety precautions as well as activity progression post-acute D/C and she and spouse verbalize and demonstrate understanding. No further acute OT needs identified at this time. Will check back as able.     Follow Up Recommendations  No OT follow up;Supervision/Assistance - 24 hour    Equipment Recommendations  None recommended by OT    Recommendations for Other Services      Precautions / Restrictions Precautions Precautions: Fall Precaution Comments: chest tube Restrictions Weight Bearing Restrictions: No       Mobility Bed Mobility               General bed mobility comments: OOB in recliner on my arrival.   Transfers Overall transfer level: Independent Equipment used: None                  Balance Overall balance assessment: Needs assistance Sitting-balance support: No upper extremity supported;Feet supported Sitting balance-Leahy Scale: Good     Standing balance support: No upper extremity supported;During functional activity Standing balance-Leahy Scale: Good                             ADL either performed or assessed with clinical judgement   ADL Overall ADL's : Independent                                       General ADL Comments: Able to complete all ADL tasks independently at this time. She was able to demonstrate techniques for LB dressing tasks, shower  transfers, and toilet transfers.      Vision   Vision Assessment?: No apparent visual deficits   Perception     Praxis      Cognition Arousal/Alertness: Awake/alert Behavior During Therapy: WFL for tasks assessed/performed Overall Cognitive Status: Within Functional Limits for tasks assessed                                          Exercises     Shoulder Instructions       General Comments VSS    Pertinent Vitals/ Pain       Pain Assessment: Faces Faces Pain Scale: Hurts a little bit Pain Location: chest tube insertion site Pain Descriptors / Indicators: Aching;Discomfort;Sore Pain Intervention(s): Monitored during session  Home Living                                          Prior Functioning/Environment              Frequency  Min 2X/week        Progress Toward Goals  OT Goals(current goals can now  be found in the care plan section)  Progress towards OT goals: Goals met/education completed, patient discharged from OT  Acute Rehab OT Goals Patient Stated Goal: get back to work OT Goal Formulation: With patient/family Time For Goal Achievement: 04/23/18 Potential to Achieve Goals: Good ADL Goals Pt Will Perform Grooming: Independently;standing Pt Will Perform Upper Body Dressing: Independently;sitting Pt Will Perform Lower Body Dressing: Independently;sit to/from stand Pt Will Transfer to Toilet: ambulating;bedside commode;regular height toilet;with modified independence(BSC over toilet) Pt Will Perform Toileting - Clothing Manipulation and hygiene: Independently;sit to/from stand Pt Will Perform Tub/Shower Transfer: ambulating;Shower transfer;grab bars;with modified independence  Plan Discharge plan remains appropriate    Co-evaluation                 AM-PAC PT "6 Clicks" Daily Activity     Outcome Measure   Help from another person eating meals?: None Help from another person taking care of personal  grooming?: None Help from another person toileting, which includes using toliet, bedpan, or urinal?: None Help from another person bathing (including washing, rinsing, drying)?: None Help from another person to put on and taking off regular upper body clothing?: None Help from another person to put on and taking off regular lower body clothing?: None 6 Click Score: 24    End of Session    OT Visit Diagnosis: Other abnormalities of gait and mobility (R26.89);Pain Pain - Right/Left: Left Pain - part of body: (ribs)   Activity Tolerance Patient tolerated treatment well   Patient Left in chair;with call bell/phone within reach   Nurse Communication Mobility status        Time: 1000-1011 OT Time Calculation (min): 11 min  Charges: OT General Charges $OT Visit: 1 Visit OT Treatments $Self Care/Home Management : 8-22 mins  Norman Herrlich, MS OTR/L  Pager: [place] [facility] L[facility] Hendriks 04/12/2018, 12:25 PM

## 2018-04-12 NOTE — Progress Notes (Signed)
Spoke with P.A this morning. Order for chest tube removal, chest tube removed before noon and order for repeat chest xray within and hour was put in. Patient has had no discharge.   Area with edema (goose egg/knot) from landing site on left flank has decreased significantly but patient still has echymosis.  Skin underneath where the tape was is not red or sore but patient c/o itching.   Chest tube removed and covered with petroleum dressing,  2x2 gauze and tape. Patient is in the chair on her side.

## 2018-04-12 NOTE — Care Management Note (Addendum)
Case Management Note  Patient Details  Name: Vanessa Crosby MRN: [id number] Date of Birth: [birth year]  Subjective/Objective:  Pt is a 57 y.o. 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  PTA, pt independent, lives with spouse.                   Action/Plan: PT/OT recommending no OP follow up.  Spouse able to provide care at discharge.    Vladimir Creeksavid Cook with [facility] [employer] is following for discharge needs: Phone: [phone] Fax:  [phone]  [facility] per his request.    Expected Discharge Date:  04/12/18               Expected Discharge Plan:  Home/Self Care  In-House Referral:  Clinical Social Work  Discharge planning Services  CM Consult  Post Acute Care Choice:    Choice offered to:     DME Arranged:    DME Agency:     HH Arranged:    HH Agency:     Status of Service:  Completed, signed off  If discussed at [employer] Meetings, dates discussed:    Additional Comments:  04/12/18 J. Wolfe Camarena, RN, BSN Completed and signed FMLA paperwork returned to pt.  Pt for discharge later today with spouse.  No dc needs identified.    Quintella BatonJulie W. Rossana Molchan, RN, BSN  Trauma/Neuro ICU Case Manager [phone]

## 2018-04-25 ENCOUNTER — Ambulatory Visit
Admission: RE | Admit: 2018-04-25 | Discharge: 2018-04-25 | Disposition: A | Discharge status: Home / Self Care | Payer: No Typology Code available for payment source | Source: Ambulatory Visit | Attending: General Surgery | Admitting: General Surgery

## 2018-04-25 ENCOUNTER — Other Ambulatory Visit: Payer: Self-pay | Admitting: General Surgery

## 2018-04-25 DIAGNOSIS — S2239XD Fracture of one rib, unspecified side, subsequent encounter for fracture with routine healing: Secondary | ICD-10-CM

## 2018-05-30 ENCOUNTER — Ambulatory Visit
Admission: RE | Admit: 2018-05-30 | Discharge: 2018-05-30 | Disposition: A | Discharge status: Home / Self Care | Payer: No Typology Code available for payment source | Source: Ambulatory Visit | Attending: General Surgery | Admitting: General Surgery

## 2018-05-30 ENCOUNTER — Other Ambulatory Visit: Payer: Self-pay | Admitting: General Surgery

## 2018-05-30 DIAGNOSIS — R0781 Pleurodynia: Secondary | ICD-10-CM

## 2018-10-22 IMAGING — CR DG CHEST 2V
2 series · 2 of 2 positions shown · non-contrast
Comparison: Chest x-ray dated April 25, 2018.

CLINICAL DATA: Left-sided chest pain. History of rib fractures and
left pneumothorax.

EXAM:
CHEST - 2 VIEW

[w chest pa]
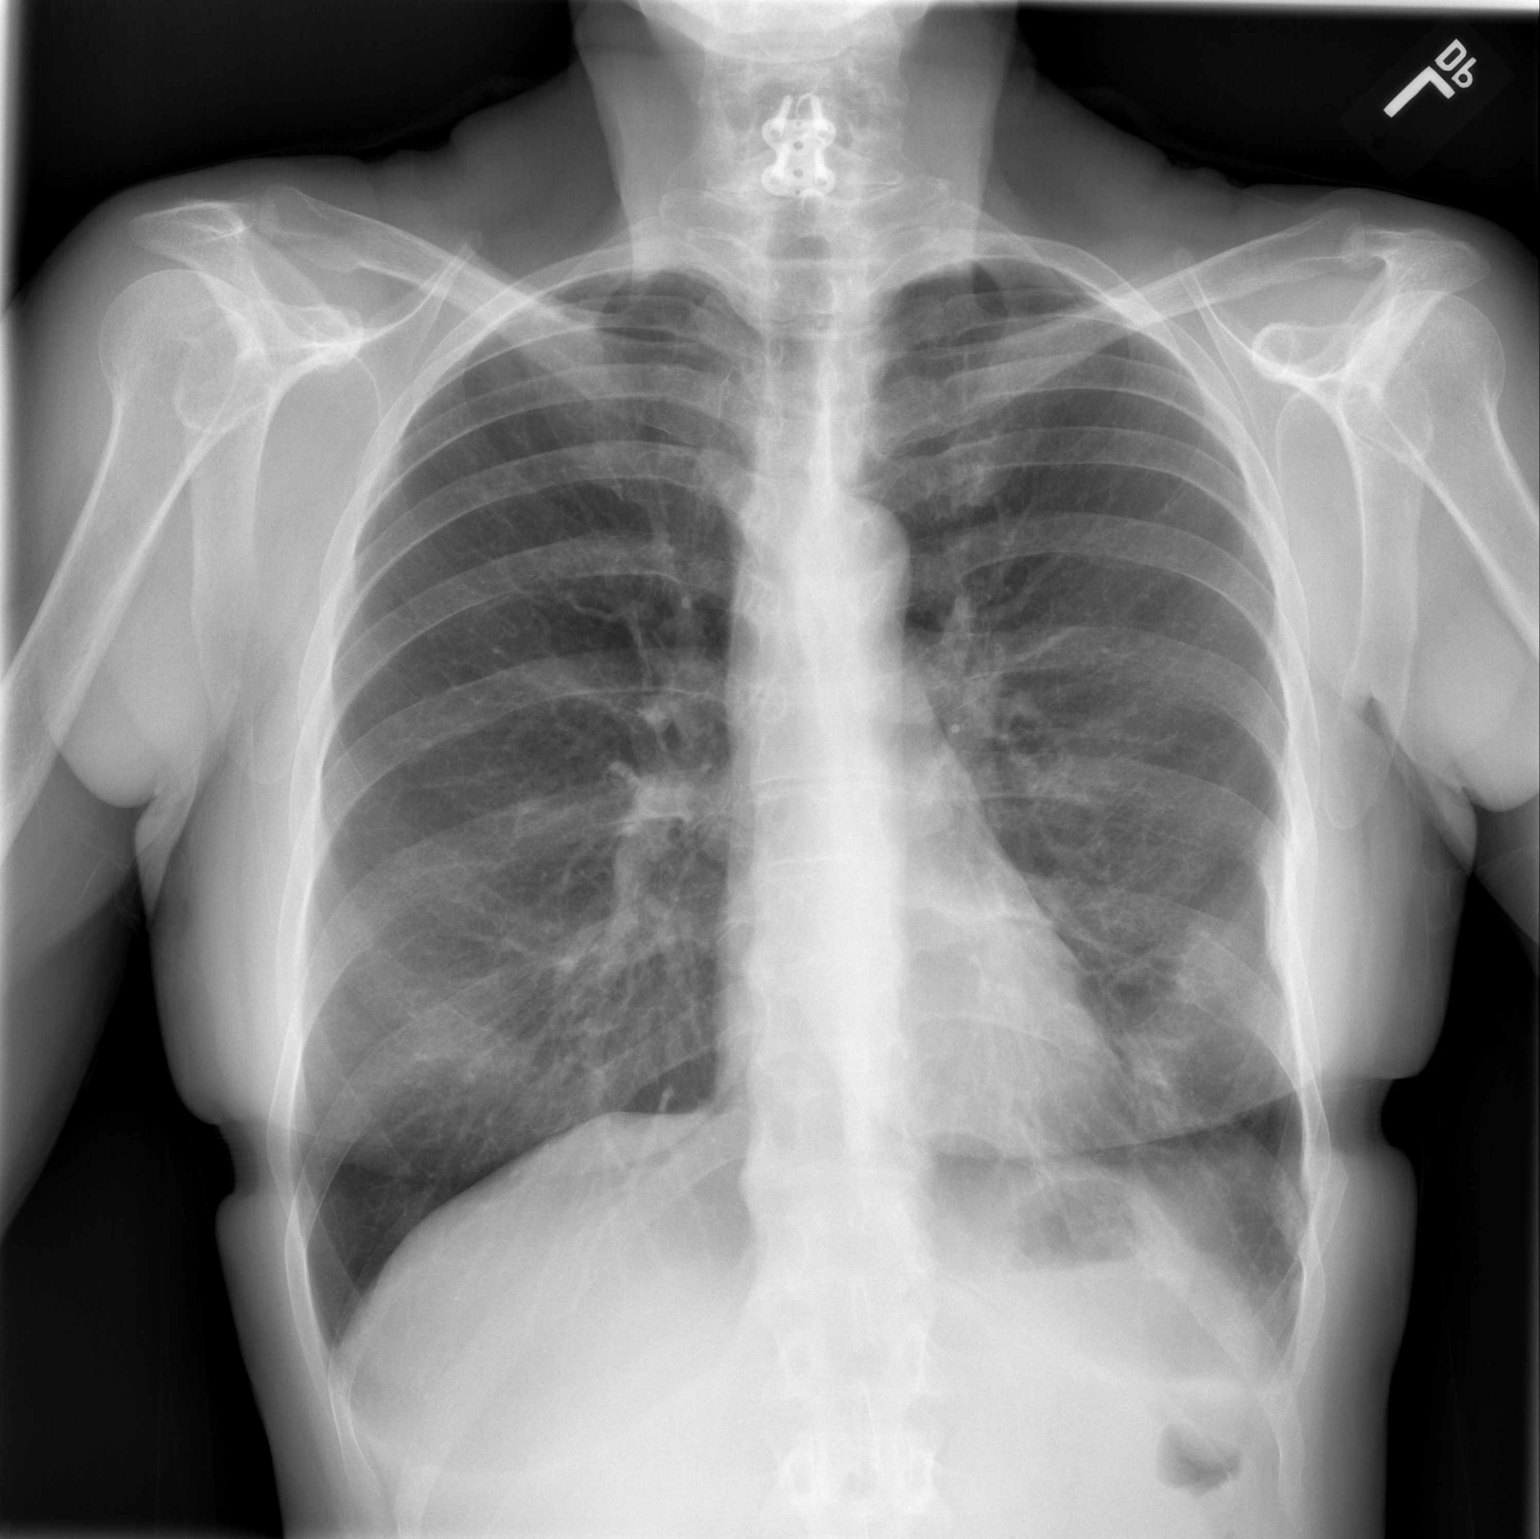

[w chest lat]
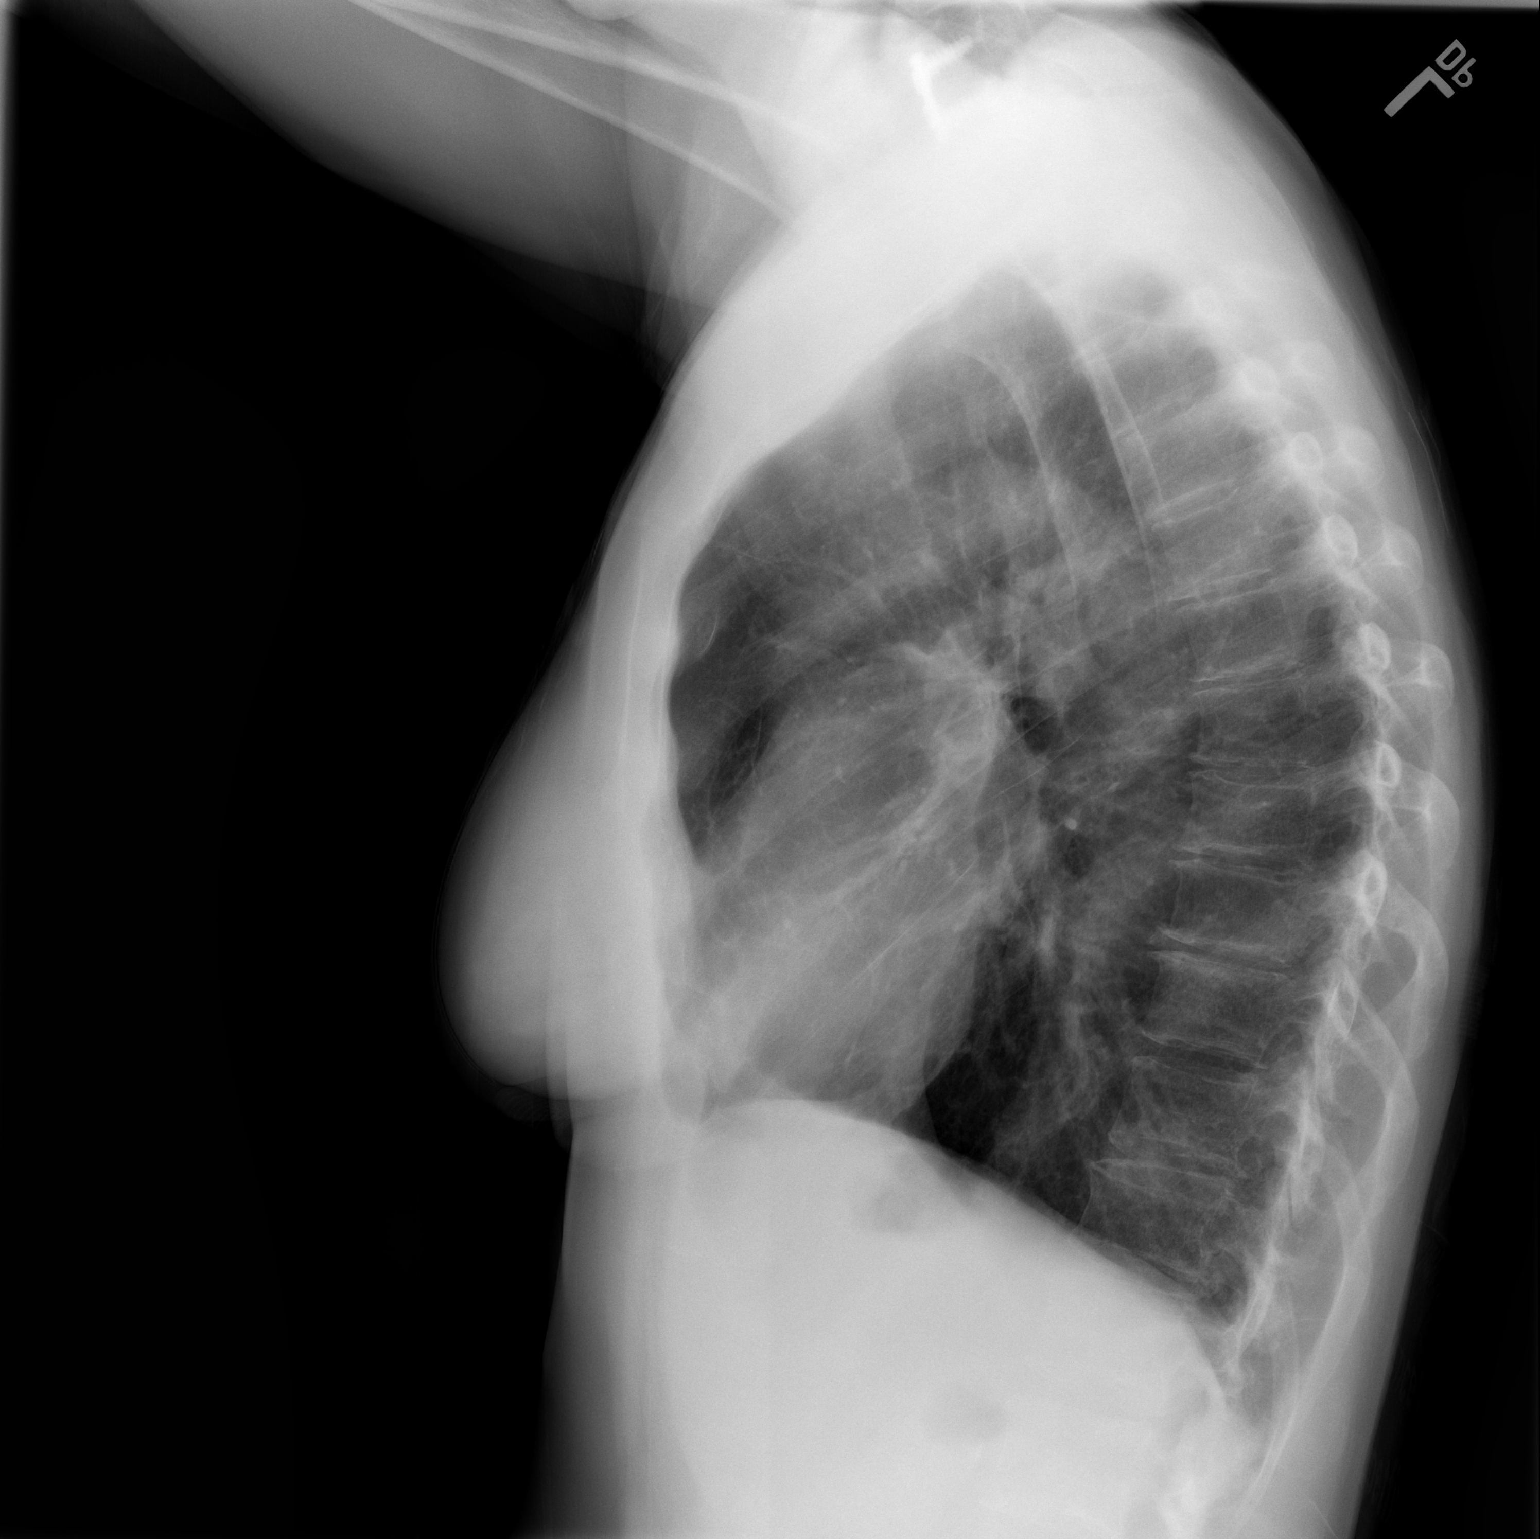

[2 of 2 positions shown; findings below may reference images not displayed]

FINDINGS: The heart size and mediastinal contours are within normal limits.
Normal pulmonary vascularity. Resolved left apical pneumothorax and
small left pleural effusion. No consolidation. Healing left-sided
rib fractures.
IMPRESSION: 1. Resolved left apical pneumothorax and left pleural effusion.
2. Healing left-sided rib fractures.

## 2019-08-02 ENCOUNTER — Other Ambulatory Visit: Payer: Self-pay | Admitting: Orthopaedic Surgery

## 2019-08-02 DIAGNOSIS — M5414 Radiculopathy, thoracic region: Secondary | ICD-10-CM

## 2019-08-06 ENCOUNTER — Other Ambulatory Visit: Payer: Self-pay

## 2019-08-06 ENCOUNTER — Ambulatory Visit
Admission: RE | Admit: 2019-08-06 | Discharge: 2019-08-06 | Disposition: A | Discharge status: Home / Self Care | Payer: BC Managed Care – PPO | Source: Ambulatory Visit | Attending: Orthopaedic Surgery | Admitting: Orthopaedic Surgery

## 2019-08-06 DIAGNOSIS — M5414 Radiculopathy, thoracic region: Secondary | ICD-10-CM

## 2019-12-29 IMAGING — CT CT T SPINE W/O CM
1 series · 12 of 14 positions shown, 15 images · non-contrast
Comparison: None.

CLINICAL DATA: Thoracic spine pain since a fall from roof in March 2018. Pain radiates into the right anterior chest.

EXAM:
CT THORACIC SPINE WITHOUT CONTRAST
TECHNIQUE: Multidetector CT images of the thoracic were obtained using the
standard protocol without intravenous contrast.

[Series 3: t spine soft · axial · 0.27mm/px · z∈[-342,-78]mm · 12 of 106 slices shown, 15 images]
[im 9/106  soft-tissue]
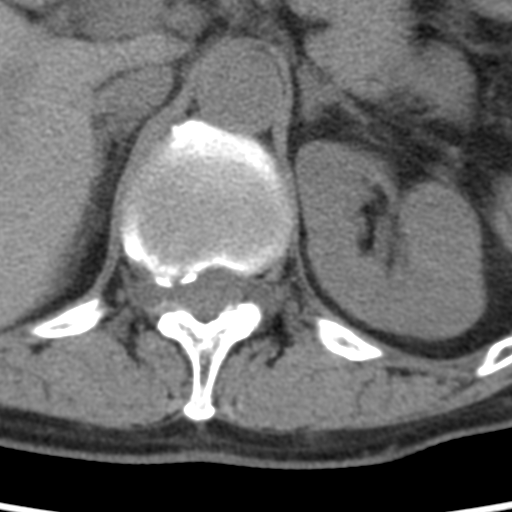
[im 9/106  bone]
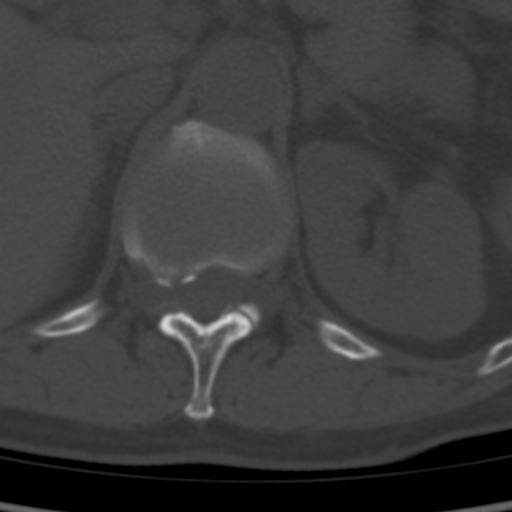
[im 17/106  bone]
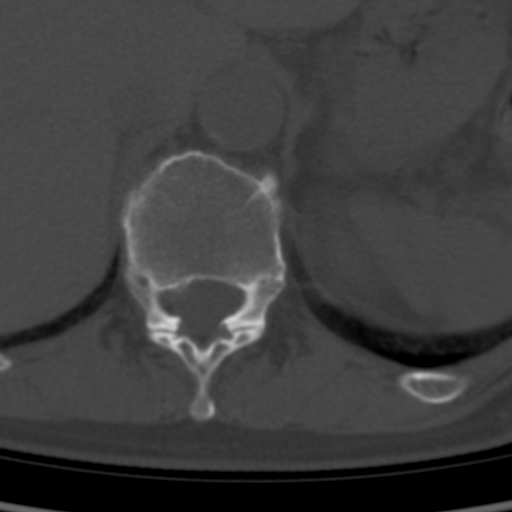
[im 25/106  bone]
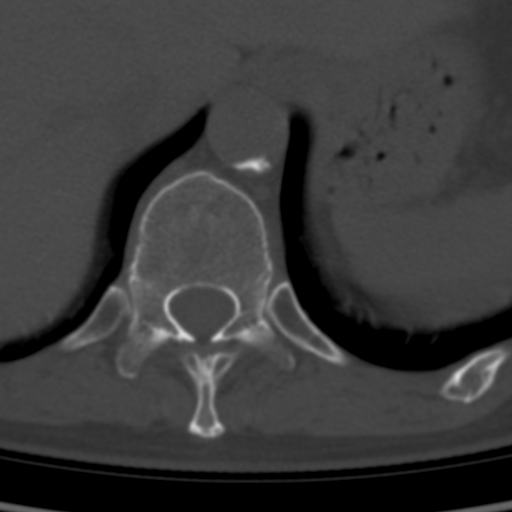
[im 33/106  bone]
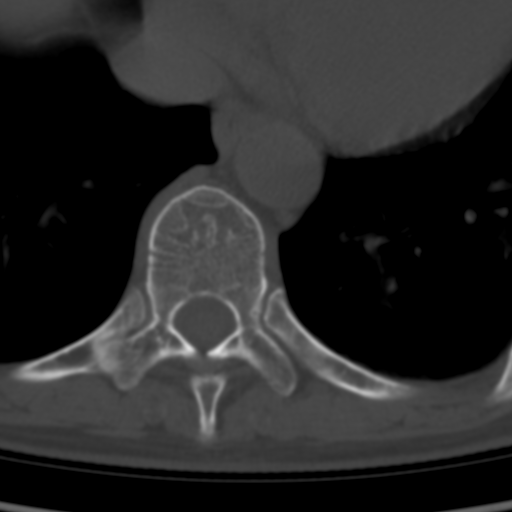
[im 41/106  soft-tissue]
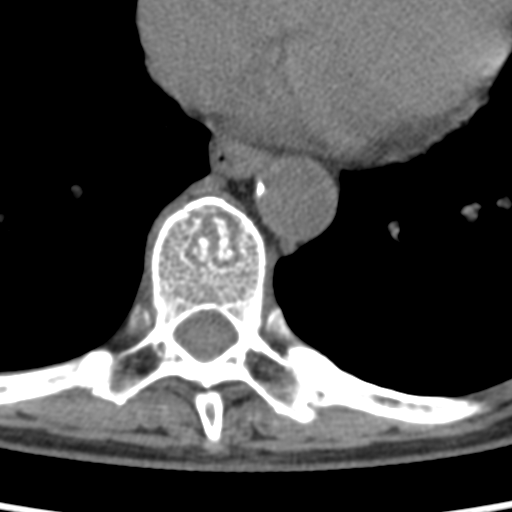
[im 41/106  bone]
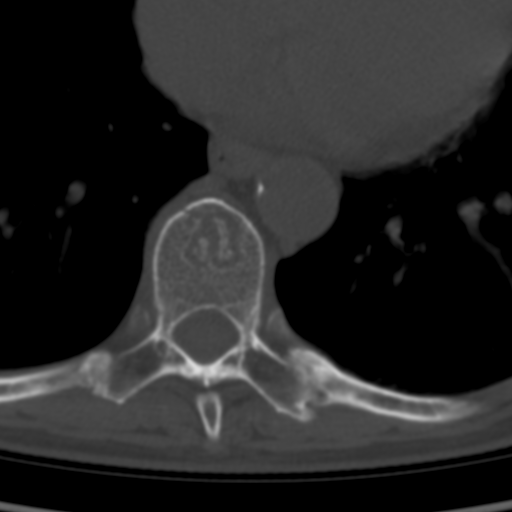
[im 49/106  bone]
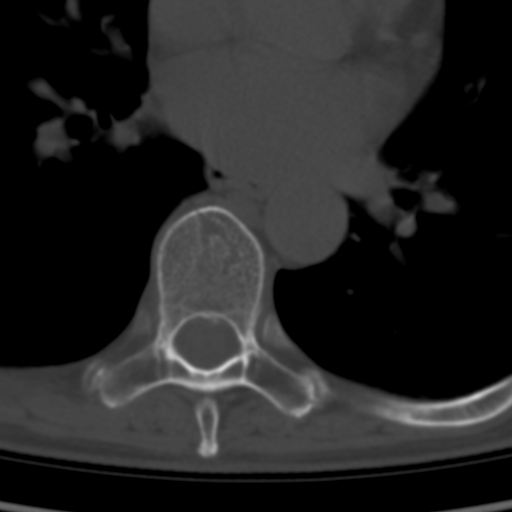
[im 57/106  bone]
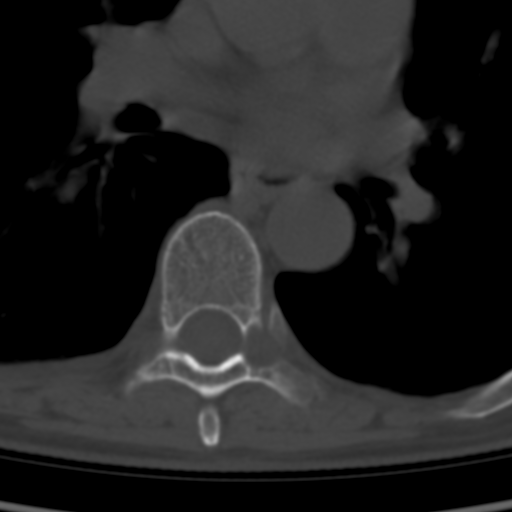
[im 65/106  bone]
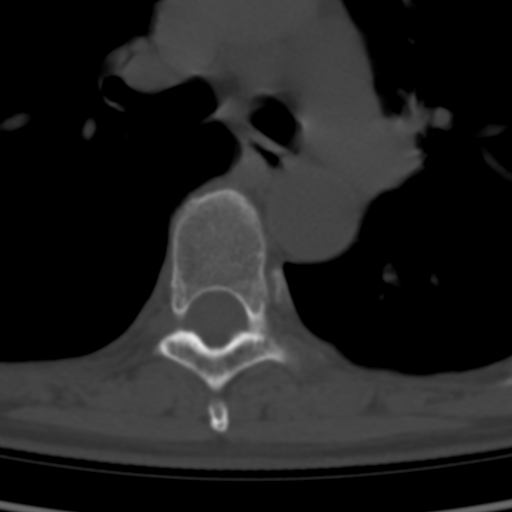
[im 73/106  soft-tissue]
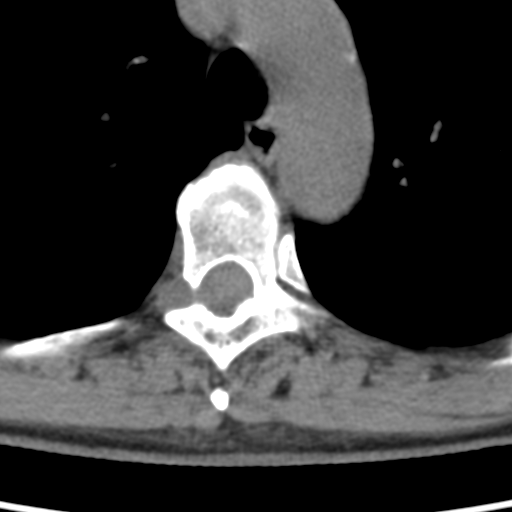
[im 73/106  bone]
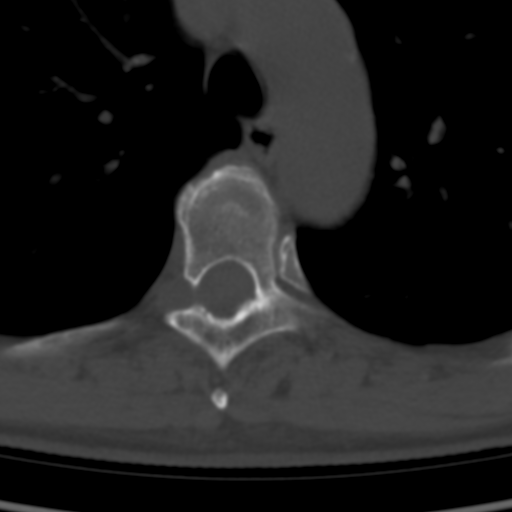
[im 81/106  bone]
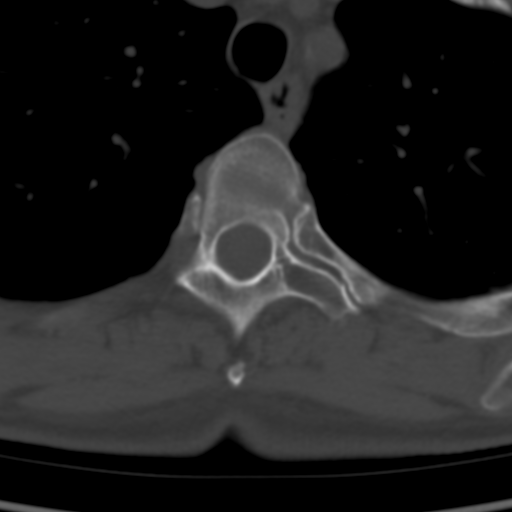
[im 89/106  bone]
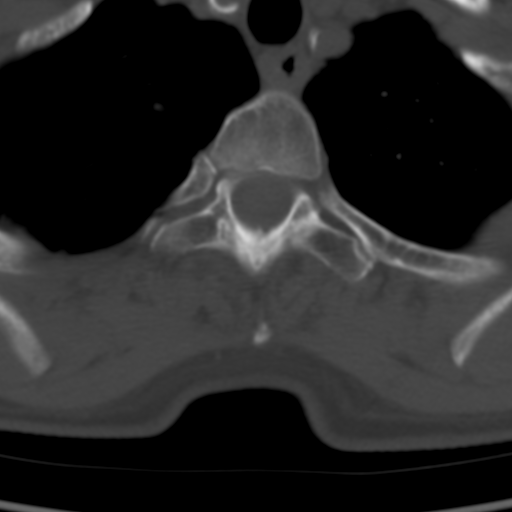
[im 97/106  bone]
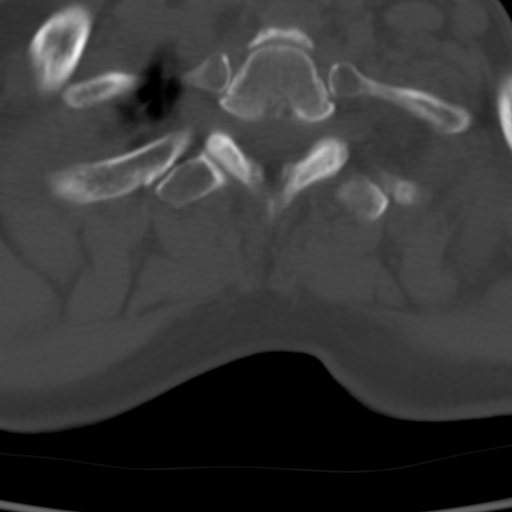

[12 of 14 positions shown; findings below may reference images not displayed]

FINDINGS: Alignment: Normal.

Vertebrae: There is no fracture or bone destruction. Slight right
facet arthritis T2-3 and T3-4. Slight left facet arthritis at T4-5,
T5-6 and T6-7. These are essentially unchanged since the prior CT
scan of 04/07/2018.

Paraspinal and other soft tissues: Normal.

Disc levels: C7-T1 through T5-6: Normal discs.

T6-7: Tiny central disc bulge with no neural impingement, minimally
more prominent than on the prior study.

T7-8: Calcification of the posterior longitudinal ligament slightly
asymmetric to the left with a disc bulge, unchanged.

T8-9: Chronic deformity of the posterior margin of T8 calcification
of the posterior longitudinal ligament without neural impingement.

T9-10 tiny calcification of the posterior longitudinal ligament and
of the ligamentum flavum. This is unchanged.

T10-11: Normal.

T11-12: Chronic calcified disc protrusion into the right lateral
recess and proximal right neural foramen, unchanged.

T12-L1: Chronic disc protrusion with accompanying osteophytes into
the right neural foramen, unchanged.
IMPRESSION: 1. No acute abnormality of the thoracic spine.
2. Chronic disc protrusions to the right at T11-12 and T12-L1 as
described. The exiting thoracic nerve roots at those levels appear
without impingement
3. No change in the appearance of the thoracic spine since the prior
CT scan of 04/07/2018.
